# Patient Record
Sex: Female | Born: 1975 | Race: Black or African American | Hispanic: No | Marital: Single | Smoking: Never smoker
Health system: Southern US, Community
[De-identification: ages and names within clinical notes are randomized; demographics above are authoritative.]

## PROBLEM LIST (undated history)

## (undated) DIAGNOSIS — F329 Major depressive disorder, single episode, unspecified: Secondary | ICD-10-CM

## (undated) DIAGNOSIS — F32A Depression, unspecified: Secondary | ICD-10-CM

## (undated) DIAGNOSIS — I10 Essential (primary) hypertension: Secondary | ICD-10-CM

## (undated) DIAGNOSIS — K759 Inflammatory liver disease, unspecified: Secondary | ICD-10-CM

## (undated) DIAGNOSIS — K769 Liver disease, unspecified: Secondary | ICD-10-CM

## (undated) DIAGNOSIS — T7840XA Allergy, unspecified, initial encounter: Secondary | ICD-10-CM

## (undated) HISTORY — PX: CHOLECYSTECTOMY, LAPAROSCOPIC: SHX56

## (undated) HISTORY — PX: REDUCTION MAMMAPLASTY: SUR839

## (undated) HISTORY — DX: Essential (primary) hypertension: I10

## (undated) HISTORY — DX: Allergy, unspecified, initial encounter: T78.40XA

## (undated) HISTORY — DX: Liver disease, unspecified: K76.9

## (undated) HISTORY — DX: Depression, unspecified: F32.A

## (undated) HISTORY — DX: Major depressive disorder, single episode, unspecified: F32.9

---

## 2013-08-15 ENCOUNTER — Ambulatory Visit (INDEPENDENT_AMBULATORY_CARE_PROVIDER_SITE_OTHER): Payer: 59 | Admitting: Family Medicine

## 2013-08-15 VITALS — BP 118/82 | HR 72 | Temp 98.4°F | Resp 18 | Ht 65.0 in | Wt 226.0 lb

## 2013-08-15 DIAGNOSIS — I10 Essential (primary) hypertension: Secondary | ICD-10-CM

## 2013-08-15 DIAGNOSIS — Z23 Encounter for immunization: Secondary | ICD-10-CM

## 2013-08-15 MED ORDER — LISINOPRIL-HYDROCHLOROTHIAZIDE 20-25 MG PO TABS: 1.0000 | ORAL_TABLET | Freq: Every day | ORAL | Status: DC

## 2013-08-15 NOTE — Progress Notes (Signed)
Subjective: 36 year old lady who is here for refill of her blood pressure medications. She's been on blood pressure meds for a number of years. When stressed her blood pressure was operationally. She does usually take a medicine regularly and has not any major problems for her. She get some edema which is not taking her medications. She has no other major complaints and review of systems was essentially normal. She does not have her regular physician at this time and we discussed that. She would like flu shot.  Patient has lost 30 pounds since last November and is working at losing more.  Objective: Pleasant lady in no major acute distress. TMs dull. Eyes PERRLA with EOMs intact. Throat clear. Neck supple without significant nodes. No carotid bruits. Chest clear. Heart regular without murmurs. No ankle edema.  Assessment: Hypertension, controlled  Plan: Flu shot Refill her medications Return in about 6 months for recheck.

## 2013-08-15 NOTE — Patient Instructions (Signed)
Continue current blood pressure medication  Return in about 6 months to followup on blood pressure.  Advise trying to decreased eating and increased exercise and lose weight.

## 2014-09-11 ENCOUNTER — Other Ambulatory Visit: Payer: Self-pay | Admitting: Family Medicine

## 2015-02-02 ENCOUNTER — Ambulatory Visit (INDEPENDENT_AMBULATORY_CARE_PROVIDER_SITE_OTHER): Payer: 59 | Admitting: Urgent Care

## 2015-02-02 VITALS — BP 110/82 | HR 74 | Temp 98.1°F | Resp 16 | Ht 64.0 in | Wt 243.0 lb

## 2015-02-02 DIAGNOSIS — J302 Other seasonal allergic rhinitis: Secondary | ICD-10-CM | POA: Diagnosis not present

## 2015-02-02 DIAGNOSIS — I10 Essential (primary) hypertension: Secondary | ICD-10-CM

## 2015-02-02 DIAGNOSIS — G4733 Obstructive sleep apnea (adult) (pediatric): Secondary | ICD-10-CM

## 2015-02-02 MED ORDER — CETIRIZINE HCL 10 MG PO TABS
10.0000 mg | ORAL_TABLET | Freq: Every day | ORAL | Status: DC
Start: 2015-02-02 — End: 2016-05-26

## 2015-02-02 MED ORDER — LISINOPRIL-HYDROCHLOROTHIAZIDE 20-25 MG PO TABS: 1.0000 | ORAL_TABLET | Freq: Every day | ORAL | Status: DC

## 2015-02-02 NOTE — Progress Notes (Addendum)
MRN: [id number] DOB: 07/13/1976  Subjective:   Hannah Sanders is a 39 y.o. female presenting for chief complaint of medication refill for HTN, OSA and seasonal allergies.   HTN - managed with lis-HCT, reports compliance with medication, denies adverse effects including lightheadedness, dizziness. Checks BP regularly, ranges 110-120's systolic. Patient reports reports 20lbs weight gain in last 6 months. Started low carb diet 1 month ago for weight loss, no breads or sugar; diet is mainly protein, vegetables and fruit. Started exercising through her work as well. Denies smoking, rare alcohol drink.  OSA - diagnosed with OSA in 2008, has used CPAP consistently since then. Unfortunately, machine stopped working ~6 months, needs a referral for sleep study to get fitted again. Admits day time somnolence, fatigue, wakes up feeling smothered worsened by working night shifts, erratic sleep schedule. Has not previously been evaluated by ENT.  Seasonal allergies - takes Zyrtec daily. Has had intermittent breakthrough itchy, watery eyes. Denies fevers, nasal congestion, sinus pain, ear pressure, ear fullness, sore throat, post-nasal drip. Has used nasal steroid spray in the past with success, not currently using this since she feels she's doing well with Zyrtec.   No smoking, rare alcohol drink. Denies any other aggravating or relieving factors, no other questions or concerns.  Hannah Sanders has a current medication list which includes the following prescription(s): lisinopril-hydrochlorothiazide. She has No Known Allergies.  Hannah Sanders  has a past medical history of Hypertension; Depression; Liver disease; and Allergy. Also  has past surgical history that includes Coronary artery bypass graft.  ROS As in subjective.  Objective:   Vitals: BP 110/82 mmHg  Pulse 74  Temp(Src) 98.1 F (36.7 C) (Oral)  Resp 16  Ht  (1.626 m)  Wt 243 lb (110.224 kg)  BMI 41.69 kg/m2  SpO2 99%  Physical Exam    Constitutional: She is oriented to person, place, and time and well-developed, well-nourished, and in no distress.  HENT:  TM's intact bilaterally, no effusions or erythema. Nares patent, nasal turbinates pink and moist. No sinus tenderness. Pharynx not visualized due to redundant soft palate, mucous membranes moist, dentition in good repair.  Eyes: Conjunctivae are normal. Right eye exhibits no discharge. Left eye exhibits no discharge. No scleral icterus.  Neck: Normal range of motion. No thyromegaly present.  Cardiovascular: Normal rate, regular rhythm and intact distal pulses.  Exam reveals no gallop and no friction rub.   No murmur heard. Pulmonary/Chest: Effort normal. No stridor. No respiratory distress. She has no wheezes. She has no rales. She exhibits no tenderness.  Lymphadenopathy:    She has no cervical adenopathy.  Neurological: She is alert and oriented to person, place, and time.  Skin: Skin is warm and dry. No rash noted. No erythema. No pallor.   Assessment and Plan :   1. Essential hypertension 2. OSA (obstructive sleep apnea) 3. Severe obesity (BMI >= 40) - Well-controlled, continue lis-HCT, bmet to be drawn 02/03/2015 (patient had to leave to pick up children). Patient confirms that she has had tubal ligation, re: contraindications for ACEi, patient also denies ever having had ACS or CABG as was documented previously in her history. - Continue healthy diet, exercise - Referral for sleep study and ENT for further evaluation of surgical treatment for OSA - Follow up in 6 months  4. Seasonal allergies - Continue Zyrtec daily, advised that she call me if she needs rx for Flonase   Wallis Bamberg, PA-C [facility]  [phone] 02/02/2015 9:51 AM

## 2015-02-02 NOTE — Patient Instructions (Addendum)
- Please come back for a 6 month follow up on your blood pressure and weight. - Please let me know if you end up needing a nasal steroid by calling and leaving me a message. - You will get a call notifying you of your appointment times for the sleep study and referral to ENT.    Sleep Apnea  Sleep apnea is a sleep disorder characterized by abnormal pauses in breathing while you sleep. When your breathing pauses, the level of oxygen in your blood decreases. This causes you to move out of deep sleep and into light sleep. As a result, your quality of sleep is poor, and the system that carries your blood throughout your body (cardiovascular system) experiences stress. If sleep apnea remains untreated, the following conditions can develop:  High blood pressure (hypertension).  Coronary artery disease.  Inability to achieve or maintain an erection (impotence).  Impairment of your thought process (cognitive dysfunction). There are three types of sleep apnea: 1. Obstructive sleep apnea--Pauses in breathing during sleep because of a blocked airway. 2. Central sleep apnea--Pauses in breathing during sleep because the area of the brain that controls your breathing does not send the correct signals to the muscles that control breathing. 3. Mixed sleep apnea--A combination of both obstructive and central sleep apnea. RISK FACTORS The following risk factors can increase your risk of developing sleep apnea:  Being overweight.  Smoking.  Having narrow passages in your nose and throat.  Being of older age.  Being female.  Alcohol use.  Sedative and tranquilizer use.  Ethnicity. Among individuals younger than 35 years, African Americans are at increased risk of sleep apnea. SYMPTOMS   Difficulty staying asleep.  Daytime sleepiness and fatigue.  Loss of energy.  Irritability.  Loud, heavy snoring.  Morning headaches.  Trouble concentrating.  Forgetfulness.  Decreased interest in  sex. DIAGNOSIS  In order to diagnose sleep apnea, your caregiver will perform a physical examination. Your caregiver may suggest that you take a home sleep test. Your caregiver may also recommend that you spend the night in a sleep lab. In the sleep lab, several monitors record information about your heart, lungs, and brain while you sleep. Your leg and arm movements and blood oxygen level are also recorded. TREATMENT The following actions may help to resolve mild sleep apnea:  Sleeping on your side.   Using a decongestant if you have nasal congestion.   Avoiding the use of depressants, including alcohol, sedatives, and narcotics.   Losing weight and modifying your diet if you are overweight. There also are devices and treatments to help open your airway:  Oral appliances. These are custom-made mouthpieces that shift your lower jaw forward and slightly open your bite. This opens your airway.  Devices that create positive airway pressure. This positive pressure "splints" your airway open to help you breathe better during sleep. The following devices create positive airway pressure:  Continuous positive airway pressure (CPAP) device. The CPAP device creates a continuous level of air pressure with an air pump. The air is delivered to your airway through a mask while you sleep. This continuous pressure keeps your airway open.  Nasal expiratory positive airway pressure (EPAP) device. The EPAP device creates positive air pressure as you exhale. The device consists of single-use valves, which are inserted into each nostril and held in place by adhesive. The valves create very little resistance when you inhale but create much more resistance when you exhale. That increased resistance creates the positive airway  pressure. This positive pressure while you exhale keeps your airway open, making it easier to breath when you inhale again.  Bilevel positive airway pressure (BPAP) device. The BPAP device  is used mainly in patients with central sleep apnea. This device is similar to the CPAP device because it also uses an air pump to deliver continuous air pressure through a mask. However, with the BPAP machine, the pressure is set at two different levels. The pressure when you exhale is lower than the pressure when you inhale.  Surgery. Typically, surgery is only done if you cannot comply with less invasive treatments or if the less invasive treatments do not improve your condition. Surgery involves removing excess tissue in your airway to create a wider passage way. Document Released: 11/07/2002 Document Revised: 03/14/2013 Document Reviewed: 03/25/2012 [facility] Patient Information 2015 [place], [place]. This information is not intended to replace advice given to you by your health care provider. Make sure you discuss any questions you have with your health care provider.

## 2015-02-03 ENCOUNTER — Other Ambulatory Visit (INDEPENDENT_AMBULATORY_CARE_PROVIDER_SITE_OTHER): Payer: 59 | Admitting: *Deleted

## 2015-02-03 DIAGNOSIS — I10 Essential (primary) hypertension: Secondary | ICD-10-CM

## 2015-02-03 LAB — BASIC METABOLIC PANEL
BUN: 14 mg/dL (ref 6–23)
CHLORIDE: 105 meq/L (ref 96–112)
CO2: 26 mEq/L (ref 19–32)
Calcium: 9.1 mg/dL (ref 8.4–10.5)
Creat: 0.95 mg/dL (ref 0.50–1.10)
GLUCOSE: 85 mg/dL (ref 70–99)
Potassium: 4.2 mEq/L (ref 3.5–5.3)
Sodium: 140 mEq/L (ref 135–145)

## 2015-02-03 MED ORDER — LISINOPRIL-HYDROCHLOROTHIAZIDE 20-25 MG PO TABS: 1.0000 | ORAL_TABLET | Freq: Every day | ORAL | Status: DC

## 2015-02-03 NOTE — Progress Notes (Unsigned)
Pt here for lab draw only  

## 2015-02-05 ENCOUNTER — Encounter: Payer: Self-pay | Admitting: Family Medicine

## 2015-02-26 ENCOUNTER — Ambulatory Visit (INDEPENDENT_AMBULATORY_CARE_PROVIDER_SITE_OTHER): Payer: 59 | Admitting: Physician Assistant

## 2015-02-26 VITALS — BP 124/82 | HR 90 | Temp 98.6°F | Resp 17 | Ht 64.0 in | Wt 238.0 lb

## 2015-02-26 DIAGNOSIS — J209 Acute bronchitis, unspecified: Secondary | ICD-10-CM | POA: Diagnosis not present

## 2015-02-26 MED ORDER — IPRATROPIUM BROMIDE 0.03 % NA SOLN: 2.0000 | Freq: Two times a day (BID) | NASAL | Status: DC

## 2015-02-26 MED ORDER — ALBUTEROL SULFATE HFA 108 (90 BASE) MCG/ACT IN AERS: 2.0000 | INHALATION_SPRAY | RESPIRATORY_TRACT | Status: DC | PRN

## 2015-02-26 MED ORDER — GUAIFENESIN ER 1200 MG PO TB12: 1.0000 | ORAL_TABLET | Freq: Two times a day (BID) | ORAL | Status: DC | PRN

## 2015-02-26 MED ORDER — BENZONATATE 100 MG PO CAPS: 100.0000 mg | ORAL_CAPSULE | Freq: Three times a day (TID) | ORAL | Status: DC | PRN

## 2015-02-26 NOTE — Patient Instructions (Signed)

## 2015-02-26 NOTE — Progress Notes (Signed)
Subjective:    Patient ID: Hannah Sanders, female    DOB: 1975-12-24, 39 y.o.   MRN: [id number]  HPI Patient presents for dry cough, chills, and congestion that have been present for 3 days. States that she feels better than yesterday, but still feels weak and tired. Cough has gotten progressively worse and is causing sore throat. Additionally endorses HA, gurgling in throat when breathing, decreased appetite, and a feeling like something is stuck in throat. Had chills and sweating at night resolved yesterday. Denies sinus/ear pressure, rhinorrhea, SOB, wheezing, dizziness, and N/V.Has seasonal allergies and endorses itchy water eyes that started prior to current issue. No h/o asthma. Denies sick contacts and has tried AlkaSeltzer cold with marginal relief.    Review of Systems  Constitutional: Positive for chills (resolved), diaphoresis (resolved), appetite change and fatigue. Negative for fever and activity change.  HENT: Positive for congestion and sore throat. Negative for ear discharge, ear pain, postnasal drip, rhinorrhea, sinus pressure, sneezing and trouble swallowing.   Eyes: Positive for discharge and itching. Negative for pain and redness.  Respiratory: Positive for cough. Negative for chest tightness, shortness of breath and wheezing.   Cardiovascular: Negative for chest pain.  Gastrointestinal: Negative for nausea and vomiting.  Musculoskeletal: Negative for neck pain.  Allergic/Immunologic: Positive for environmental allergies.  Neurological: Positive for headaches. Negative for dizziness.       Objective:   Physical Exam  Constitutional: She is oriented to person, place, and time. She appears well-developed and well-nourished. No distress.  Blood pressure 124/82, pulse 90, temperature 98.6 F (37 C), temperature source Oral, resp. rate 17, height  (1.626 m), weight 238 lb (107.956 kg), last menstrual period 12/28/2014, SpO2 96 %.  HENT:  Head: Normocephalic and  atraumatic.  Right Ear: Tympanic membrane, external ear and ear canal normal.  Left Ear: Tympanic membrane, external ear and ear canal normal.  Nose: Rhinorrhea (with marginal erythema) present. No mucosal edema. Right sinus exhibits no maxillary sinus tenderness and no frontal sinus tenderness. Left sinus exhibits no maxillary sinus tenderness and no frontal sinus tenderness.  Mouth/Throat: Uvula is midline, oropharynx is clear and moist and mucous membranes are normal. No oropharyngeal exudate, posterior oropharyngeal edema, posterior oropharyngeal erythema or tonsillar abscesses.  Eyes: Conjunctivae are normal. Pupils are equal, round, and reactive to light. Right eye exhibits no discharge. Left eye exhibits no discharge. No scleral icterus.  Neck: Normal range of motion. Neck supple. No thyromegaly present.  Cardiovascular: Normal heart sounds.  Exam reveals no gallop and no friction rub.   No murmur heard. Pulmonary/Chest: Effort normal. No accessory muscle usage. No respiratory distress. She has no decreased breath sounds. She has wheezes (mild) in the right upper field. She has no rhonchi. She has no rales. She exhibits no tenderness.  Abdominal: Soft. Bowel sounds are normal. There is no tenderness.  Lymphadenopathy:    She has cervical adenopathy.  Neurological: She is alert and oriented to person, place, and time.  Skin: Skin is warm and dry. No rash noted. She is not diaphoretic. No erythema. No pallor.       Assessment & Plan:  1. Acute bronchitis, unspecified organism Mild wheezing on PE should use inhaler every 4 hours for next 2 days. If not improvement by end of week should call back and will send Zpack to pharmacy. - albuterol (PROVENTIL HFA;VENTOLIN HFA) 108 (90 BASE) MCG/ACT inhaler; Inhale 2 puffs into the lungs every 4 (four) hours as needed for wheezing or shortness  of breath (cough, shortness of breath or wheezing.).  Dispense: 1 Inhaler; Refill: 1 - benzonatate  (TESSALON) 100 MG capsule; Take 1-2 capsules (100-200 mg total) by mouth 3 (three) times daily as needed for cough.  Dispense: 40 capsule; Refill: 0 - ipratropium (ATROVENT) 0.03 % nasal spray; Place 2 sprays into both nostrils 2 (two) times daily.  Dispense: 30 mL; Refill: 0 - Guaifenesin (MUCINEX MAXIMUM STRENGTH) 1200 MG TB12; Take 1 tablet (1,200 mg total) by mouth every 12 (twelve) hours as needed.  Dispense: 14 tablet; Refill: 1   Maisa Bedingfield PA-C  [facility] 02/26/2015 9:32 AM

## 2015-03-01 ENCOUNTER — Telehealth: Payer: Self-pay

## 2015-03-01 MED ORDER — AZITHROMYCIN 250 MG PO TABS: ORAL_TABLET | ORAL | Status: DC

## 2015-03-01 NOTE — Telephone Encounter (Signed)
Azithromycin sent to pharmacy. Two capsules 1st day then once daily for next 4 days. Please let her know to return to clinic if she is not better after a few days of the antibiotic.

## 2015-03-01 NOTE — Telephone Encounter (Signed)
Gave pt message. She also states atrovent isn't working.

## 2015-03-01 NOTE — Telephone Encounter (Signed)
Patient states if she was not any better an antibiotic would be called in for her.   She is not better actually worse.  Please call an antibiotic into [facility] on [address]    [phone]

## 2015-03-01 NOTE — Telephone Encounter (Signed)
The atrovent is not working for what? The nasal congestion? Can you please send the reply to T[facility] as I was happy to fill the azithro but it would be best for [facility] to take a look at this since she saw the pt. Thanks.

## 2015-03-01 NOTE — Telephone Encounter (Signed)
1. Acute bronchitis, unspecified organism Mild wheezing on PE should use inhaler every 4 hours for next 2 days. If not improvement by end of week should call back and will send Zpack to pharmacy.  Please advise. Pt seen on 02/26/2015.

## 2015-03-02 NOTE — Telephone Encounter (Signed)
Left message for pt to call back  °

## 2015-03-03 ENCOUNTER — Telehealth: Payer: Self-pay | Admitting: Physician Assistant

## 2015-03-03 NOTE — Telephone Encounter (Signed)
Spoke with patient. She has picked up antibiotic and is still taking inhaler. Breathing has improved. Atrovent was not working for her so advised her to d/c. Did not get Mucinex, but plans on picking it up today.

## 2015-03-03 NOTE — Telephone Encounter (Signed)
Hannah Sanders-- FYI. Please advise.

## 2015-04-23 ENCOUNTER — Telehealth: Payer: Self-pay

## 2015-04-23 NOTE — Telephone Encounter (Signed)
Patient is requesting our office to put in another referral for her to have a sleep study. Per patient the one enter prior she waited too long to be seen and [facility] will not see her without one. Patients call back number is [phone]

## 2015-04-24 ENCOUNTER — Other Ambulatory Visit: Payer: Self-pay | Admitting: Radiology

## 2015-04-24 DIAGNOSIS — G4733 Obstructive sleep apnea (adult) (pediatric): Secondary | ICD-10-CM

## 2015-04-24 NOTE — Telephone Encounter (Signed)
Resent referral and notified pt.

## 2015-04-24 NOTE — Telephone Encounter (Signed)
Is this ok to do?

## 2015-04-24 NOTE — Telephone Encounter (Signed)
Ok to resend referral ?

## 2015-05-09 ENCOUNTER — Telehealth: Payer: Self-pay

## 2015-05-09 ENCOUNTER — Institutional Professional Consult (permissible substitution): Payer: 59 | Admitting: Neurology

## 2015-05-09 ENCOUNTER — Telehealth: Payer: Self-pay | Admitting: Neurology

## 2015-05-09 NOTE — Telephone Encounter (Signed)
No show for sleep medicine consultation on 05-09-15.  This patient has not responded to a previous referral of March 2016 when called to make appointment . She did not cancel.

## 2015-05-09 NOTE — Telephone Encounter (Signed)
Pt did not show for her appt with Dr. Dohmeier today. 

## 2015-05-22 ENCOUNTER — Encounter: Payer: Self-pay | Admitting: Neurology

## 2016-02-27 ENCOUNTER — Ambulatory Visit (INDEPENDENT_AMBULATORY_CARE_PROVIDER_SITE_OTHER): Payer: 59

## 2016-02-27 ENCOUNTER — Ambulatory Visit (INDEPENDENT_AMBULATORY_CARE_PROVIDER_SITE_OTHER): Payer: 59 | Admitting: Family Medicine

## 2016-02-27 VITALS — BP 120/70 | HR 81 | Temp 99.0°F | Resp 20 | Ht 64.0 in | Wt 200.6 lb

## 2016-02-27 DIAGNOSIS — R6889 Other general symptoms and signs: Secondary | ICD-10-CM

## 2016-02-27 DIAGNOSIS — J209 Acute bronchitis, unspecified: Secondary | ICD-10-CM

## 2016-02-27 DIAGNOSIS — J9801 Acute bronchospasm: Secondary | ICD-10-CM

## 2016-02-27 DIAGNOSIS — J111 Influenza due to unidentified influenza virus with other respiratory manifestations: Secondary | ICD-10-CM

## 2016-02-27 LAB — POCT INFLUENZA A/B
INFLUENZA B, POC: NEGATIVE
Influenza A, POC: NEGATIVE

## 2016-02-27 MED ORDER — OSELTAMIVIR PHOSPHATE 75 MG PO CAPS: 75.0000 mg | ORAL_CAPSULE | Freq: Two times a day (BID) | ORAL | Status: DC

## 2016-02-27 MED ORDER — GUAIFENESIN ER 1200 MG PO TB12: 1.0000 | ORAL_TABLET | Freq: Two times a day (BID) | ORAL | Status: DC | PRN

## 2016-02-27 MED ORDER — BENZONATATE 100 MG PO CAPS
100.0000 mg | ORAL_CAPSULE | Freq: Three times a day (TID) | ORAL | Status: DC | PRN
Start: 2016-02-27 — End: 2016-05-26

## 2016-02-27 MED ORDER — ALBUTEROL SULFATE HFA 108 (90 BASE) MCG/ACT IN AERS: 2.0000 | INHALATION_SPRAY | Freq: Four times a day (QID) | RESPIRATORY_TRACT | Status: DC | PRN

## 2016-02-27 NOTE — Progress Notes (Signed)
Subjective:    Patient ID: Hannah Sanders, female    DOB: 10/25/1976, 40 y.o.   MRN: [id number]  02/27/2016  Influenza and Anxiety   HPI This 40 y.o. female presents for evaluation of flu like symptoms.  Onset with fever/chills.  +achy; body aches.  +coughing.  +sore throat.  +rhinorrhea; no nasal congestion.  No v; no eating in three days.  No diarrhea but loose.  +SOB with laying down.  No chest pain. +wheezing; no asthma; no tobacco.  No flu vaccine.  Works at [employer]; lots of coworkers sick.    Anxiety: feels anxious.   PCP: none   Review of Systems  Constitutional: Negative for fever, chills, diaphoresis and fatigue.  Eyes: Negative for visual disturbance.  Respiratory: Negative for cough and shortness of breath.   Cardiovascular: Negative for chest pain, palpitations and leg swelling.  Gastrointestinal: Negative for nausea, vomiting, abdominal pain, diarrhea and constipation.  Endocrine: Negative for cold intolerance, heat intolerance, polydipsia, polyphagia and polyuria.  Neurological: Negative for dizziness, tremors, seizures, syncope, facial asymmetry, speech difficulty, weakness, light-headedness, numbness and headaches.    Past Medical History  Diagnosis Date  . Hypertension   . Depression   . Liver disease     Diagnosed With Hep C  . Allergy    History reviewed. No pertinent past surgical history. No Known Allergies  Social History   Social History  . Marital Status: Single    Spouse Name: N/A  . Number of Children: N/A  . Years of Education: N/A   Occupational History  . Not on file.   Social History Main Topics  . Smoking status: Never Smoker   . Smokeless tobacco: Never Used  . Alcohol Use: Yes  . Drug Use: No  . Sexual Activity: Not on file   Other Topics Concern  . Not on file   Social History Narrative   Family History  Problem Relation Age of Onset  . Heart disease Mother   . Hypertension Mother   . Heart disease Father     . Hypertension Father   . Heart disease Brother   . Heart disease Maternal Grandmother   . Hypertension Maternal Grandmother        Objective:    BP 120/70 mmHg  Pulse 81  Temp(Src) 99 F (37.2 C) (Oral)  Resp 20  Ht 5\' 4"  (1.626 m)  Wt 200 lb 9.6 oz (90.992 kg)  BMI 34.42 kg/m2  SpO2 98%  LMP 02/13/2016 Physical Exam  Constitutional: She is oriented to person, place, and time. She appears well-developed and well-nourished. No distress.  HENT:  Head: Normocephalic and atraumatic.  Right Ear: External ear normal.  Left Ear: External ear normal.  Nose: Nose normal.  Mouth/Throat: Oropharynx is clear and moist.  Eyes: Conjunctivae and EOM are normal. Pupils are equal, round, and reactive to light.  Neck: Normal range of motion. Neck supple. Carotid bruit is not present. No thyromegaly present.  Cardiovascular: Normal rate, regular rhythm, normal heart sounds and intact distal pulses.  Exam reveals no gallop and no friction rub.   No murmur heard. Pulmonary/Chest: Effort normal and breath sounds normal. She has no wheezes. She has no rales.  Abdominal: Soft. Bowel sounds are normal. She exhibits no distension and no mass. There is no tenderness. There is no rebound and no guarding.  Lymphadenopathy:    She has no cervical adenopathy.  Neurological: She is alert and oriented to person, place, and time. No cranial nerve deficit.  Skin: Skin is warm and dry. No rash noted. She is not diaphoretic. No erythema. No pallor.  Psychiatric: She has a normal mood and affect. Her behavior is normal.   Results for orders placed or performed in visit on 02/27/16  POCT Influenza A/B  Result Value Ref Range   Influenza A, POC Negative Negative   Influenza B, POC Negative Negative       Assessment & Plan:   1. Influenza   2. Flu-like symptoms   3. Acute bronchitis, unspecified organism   4. Bronchospasm     Orders Placed This Encounter  Procedures  . DG Chest 2 View    Standing  Status: Future     Number of Occurrences: 1     Standing Expiration Date: 02/26/2017    Order Specific Question:  Reason for Exam (SYMPTOM  OR DIAGNOSIS REQUIRED)    Answer:  SOB, flu like symptoms    Order Specific Question:  Is the patient pregnant?    Answer:  No    Order Specific Question:  Preferred imaging location?    Answer:  External  . POCT Influenza A/B   Meds ordered this encounter  Medications  . oseltamivir (TAMIFLU) 75 MG capsule    Sig: Take 1 capsule (75 mg total) by mouth 2 (two) times daily.    Dispense:  10 capsule    Refill:  0  . Guaifenesin (MUCINEX MAXIMUM STRENGTH) 1200 MG TB12    Sig: Take 1 tablet (1,200 mg total) by mouth every 12 (twelve) hours as needed.    Dispense:  20 tablet    Refill:  0  . benzonatate (TESSALON) 100 MG capsule    Sig: Take 1-2 capsules (100-200 mg total) by mouth 3 (three) times daily as needed for cough.    Dispense:  40 capsule    Refill:  0  . albuterol (PROVENTIL HFA;VENTOLIN HFA) 108 (90 Base) MCG/ACT inhaler    Sig: Inhale 2 puffs into the lungs every 6 (six) hours as needed for wheezing or shortness of breath (cough, shortness of breath or wheezing.).    Dispense:  1 Inhaler    Refill:  0    No Follow-up on file.    Kally Cadden Paulita Fujita, M.D. [facility] [address] [place], [place]  [phone] phone [phone] fax

## 2016-02-27 NOTE — Patient Instructions (Addendum)
IF you received an x-ray today, you will receive an invoice from [facility]. Please contact [facility] at [phone] with questions or concerns regarding your invoice.   IF you received labwork today, you will receive an invoice from [employer]. Please contact [facility] at [phone] with questions or concerns regarding your invoice.   Our billing staff will not be able to assist you with questions regarding bills from these companies.  You will be contacted with the lab results as soon as they are available. The fastest way to get your results is to activate your My Chart account. Instructions are located on the last page of this paperwork. If you have not heard from [place] regarding the results in 2 weeks, please contact this office.     TYLENOL EVERY EIGHT HOURS ALTERNATING WITH IBUPROFEN EVERY EIGHT HOURS. USE INHALER 2 PUFFS EVERY SIX HOURS FOR ONE WEEK.   Influenza, Adult Influenza ("the flu") is a viral infection of the respiratory tract. It occurs more often in winter months because people spend more time in close contact with one another. Influenza can make you feel very sick. Influenza easily spreads from person to person (contagious). CAUSES  Influenza is caused by a virus that infects the respiratory tract. You can catch the virus by breathing in droplets from an infected person's cough or sneeze. You can also catch the virus by touching something that was recently contaminated with the virus and then touching your mouth, nose, or eyes. RISKS AND COMPLICATIONS You may be at risk for a more severe case of influenza if you smoke cigarettes, have diabetes, have chronic heart disease (such as heart failure) or lung disease (such as asthma), or if you have a weakened immune system. Elderly people and pregnant women are also at risk for more serious infections. The most common problem of influenza is a lung infection (pneumonia).  Sometimes, this problem can require emergency medical care and may be life threatening. SIGNS AND SYMPTOMS  Symptoms typically last 4 to 10 days and may include:  Fever.  Chills.  Headache, body aches, and muscle aches.  Sore throat.  Chest discomfort and cough.  Poor appetite.  Weakness or feeling tired.  Dizziness.  Nausea or vomiting. DIAGNOSIS  Diagnosis of influenza is often made based on your history and a physical exam. A nose or throat swab test can be done to confirm the diagnosis. TREATMENT  In mild cases, influenza goes away on its own. Treatment is directed at relieving symptoms. For more severe cases, your health care provider may prescribe antiviral medicines to shorten the sickness. Antibiotic medicines are not effective because the infection is caused by a virus, not by bacteria. HOME CARE INSTRUCTIONS  Take medicines only as directed by your health care provider.  Use a cool mist humidifier to make breathing easier.  Get plenty of rest until your temperature returns to normal. This usually takes 3 to 4 days.  Drink enough fluid to keep your urine clear or pale yellow.  Cover yourmouth and nosewhen coughing or sneezing,and wash your handswellto prevent thevirusfrom spreading.  Stay homefromwork orschool untilthe fever is gonefor at least 63full day. PREVENTION  An annual influenza vaccination (flu shot) is the best way to avoid getting influenza. An annual flu shot is now routinely recommended for all adults in the [place] SEEK MEDICAL CARE IF:  You experiencechest pain, yourcough worsens,or you producemore mucus.  Youhave nausea,vomiting, ordiarrhea.  Your fever returns or gets worse. SEEK  IMMEDIATE MEDICAL CARE IF:  You havetrouble breathing, you become short of breath,or your skin ornails becomebluish.  You have severe painor stiffnessin the neck.  You develop a sudden headache, or pain in the face or ear.  You have  nausea or vomiting that you cannot control. MAKE SURE YOU:   Understand these instructions.  Will watch your condition.  Will get help right away if you are not doing well or get worse.   This information is not intended to replace advice given to you by your health care provider. Make sure you discuss any questions you have with your health care provider.   Document Released: 11/14/2000 Document Revised: 12/08/2014 Document Reviewed: 02/16/2012 Elsevier Interactive Patient Education [employer]2016 Elsevier Inc.

## 2016-03-11 DIAGNOSIS — IMO0002 Reserved for concepts with insufficient information to code with codable children: Secondary | ICD-10-CM | POA: Insufficient documentation

## 2016-04-04 ENCOUNTER — Other Ambulatory Visit: Payer: Self-pay | Admitting: Urgent Care

## 2016-05-01 HISTORY — PX: ABDOMINAL SURGERY: SHX537

## 2016-05-22 ENCOUNTER — Ambulatory Visit (INDEPENDENT_AMBULATORY_CARE_PROVIDER_SITE_OTHER): Payer: 59 | Admitting: Family Medicine

## 2016-05-22 VITALS — BP 122/80 | HR 68 | Temp 98.6°F | Resp 18 | Ht 64.0 in | Wt 205.0 lb

## 2016-05-22 DIAGNOSIS — M545 Low back pain, unspecified: Secondary | ICD-10-CM

## 2016-05-22 DIAGNOSIS — Z4802 Encounter for removal of sutures: Secondary | ICD-10-CM | POA: Diagnosis not present

## 2016-05-22 DIAGNOSIS — T8131XA Disruption of external operation (surgical) wound, not elsewhere classified, initial encounter: Secondary | ICD-10-CM | POA: Diagnosis not present

## 2016-05-22 MED ORDER — CYCLOBENZAPRINE HCL 10 MG PO TABS: 10.0000 mg | ORAL_TABLET | Freq: Three times a day (TID) | ORAL | Status: DC | PRN

## 2016-05-22 NOTE — Patient Instructions (Addendum)
Please return in 7 days to have your suture removed  Laceration Care, Adult A laceration is a cut that goes through all of the layers of the skin and into the tissue that is right under the skin. Some lacerations heal on their own. Others need to be closed with stitches (sutures), staples, skin adhesive strips, or skin glue. Proper laceration care minimizes the risk of infection and helps the laceration to heal better. HOW TO CARE FOR YOUR LACERATION If sutures or staples were used:  Keep the wound clean and dry.  If you were given a bandage (dressing), you should change it at least one time per day or as told by your health care provider. You should also change it if it becomes wet or dirty.  Keep the wound completely dry for the first 24 hours or as told by your health care provider. After that time, you may shower or bathe. However, make sure that the wound is not soaked in water until after the sutures or staples have been removed.  Clean the wound one time each day or as told by your health care provider:  Wash the wound with soap and water.  Rinse the wound with water to remove all soap.  Pat the wound dry with a clean towel. Do not rub the wound.  After cleaning the wound, apply a thin layer of antibiotic ointmentas told by your health care provider. This will help to prevent infection and keep the dressing from sticking to the wound.  Have the sutures or staples removed as told by your health care provider. If skin adhesive strips were used:  Keep the wound clean and dry.  If you were given a bandage (dressing), you should change it at least one time per day or as told by your health care provider. You should also change it if it becomes dirty or wet.  Do not get the skin adhesive strips wet. You may shower or bathe, but be careful to keep the wound dry.  If the wound gets wet, pat it dry with a clean towel. Do not rub the wound.  Skin adhesive strips fall off on their own.  You may trim the strips as the wound heals. Do not remove skin adhesive strips that are still stuck to the wound. They will fall off in time. If skin glue was used:  Try to keep the wound dry, but you may briefly wet it in the shower or bath. Do not soak the wound in water, such as by swimming.  After you have showered or bathed, gently pat the wound dry with a clean towel. Do not rub the wound.  Do not do any activities that will make you sweat heavily until the skin glue has fallen off on its own.  Do not apply liquid, cream, or ointment medicine to the wound while the skin glue is in place. Using those may loosen the film before the wound has healed.  If you were given a bandage (dressing), you should change it at least one time per day or as told by your health care provider. You should also change it if it becomes dirty or wet.  If a dressing is placed over the wound, be careful not to apply tape directly over the skin glue. Doing that may cause the glue to be pulled off before the wound has healed.  Do not pick at the glue. The skin glue usually remains in place for 5-10 days, then it falls  off of the skin. General Instructions  Take over-the-counter and prescription medicines only as told by your health care provider.  If you were prescribed an antibiotic medicine or ointment, take or apply it as told by your doctor. Do not stop using it even if your condition improves.  To help prevent scarring, make sure to cover your wound with sunscreen whenever you are outside after stitches are removed, after adhesive strips are removed, or when glue remains in place and the wound is healed. Make sure to wear a sunscreen of at least 30 SPF.  Do not scratch or pick at the wound.  Keep all follow-up visits as told by your health care provider. This is important.  Check your wound every day for signs of infection. Watch for:  Redness, swelling, or pain.  Fluid, blood, or pus.  Raise  (elevate) the injured area above the level of your heart while you are sitting or lying down, if possible. SEEK MEDICAL CARE IF:  You received a tetanus shot and you have swelling, severe pain, redness, or bleeding at the injection site.  You have a fever.  A wound that was closed breaks open.  You notice a bad smell coming from your wound or your dressing.  You notice something coming out of the wound, such as wood or glass.  Your pain is not controlled with medicine.  You have increased redness, swelling, or pain at the site of your wound.  You have fluid, blood, or pus coming from your wound.  You notice a change in the color of your skin near your wound.  You need to change the dressing frequently due to fluid, blood, or pus draining from the wound.  You develop a new rash.  You develop numbness around the wound. SEEK IMMEDIATE MEDICAL CARE IF:  You develop severe swelling around the wound.  Your pain suddenly increases and is severe.  You develop painful lumps near the wound or on skin that is anywhere on your body.  You have a red streak going away from your wound.  The wound is on your hand or foot and you cannot properly move a finger or toe.  The wound is on your hand or foot and you notice that your fingers or toes look pale or bluish.   This information is not intended to replace advice given to you by your health care provider. Make sure you discuss any questions you have with your health care provider.   Document Released: 11/17/2005 Document Revised: 04/03/2015 Document Reviewed: 11/13/2014 Elsevier Interactive Patient Education 2016 [employer]     IF you received an x-ray today, you will receive an invoice from [facility]. Please contact [facility] at [phone] with questions or concerns regarding your invoice.   IF you received labwork today, you will receive an invoice from [facility]. Please  contact [facility] at [phone] with questions or concerns regarding your invoice.   Our billing staff will not be able to assist you with questions regarding bills from these companies.  You will be contacted with the lab results as soon as they are available. The fastest way to get your results is to activate your My Chart account. Instructions are located on the last page of this paperwork. If you have not heard from us regarding the results in 2 weeks, please contact this office.    We recommend that you schedule a mammogram for breast cancer screening. Typically, you do not need a referral  to do this. Please contact a local imaging center to schedule your mammogram.  [facility] - [phone]  *ask for the Radiology Department [facility]) - [phone] or [phone]) [phone]  [facility] - [phone] [facility] - [phone] [facility] - [phone]  *ask for the Radiology Department [facility] - [phone]  *ask for the Radiology Department [facility] - [phone]  *ask for the Mammography Department [facility] - [phone]

## 2016-05-22 NOTE — Progress Notes (Signed)
Verbal consent obtained.  Cleansed with alcohol swab.  1% lidocaine without epi placed at procedure site.  <.7cm open wound sutured with 5-0 ethilon in 1 simple interrupted suture.  Cleansed with normal saline.  Dressing applied.

## 2016-05-22 NOTE — Progress Notes (Signed)
   Subjective:    Patient ID: Hannah Sanders, female    DOB: 02/06/1976, 40 y.o.   MRN: [id number]  HPI This is a 40 yo female who presents today following abdominoplasty surgery in [place]. The surgery was 05/09/16 and she presents today for removal of sutures. She was late seeking removal of sutures because the pain caused her to be homebound. Someone has brought her here today as she feels unable to drive. She went to her PCP who was unable to remove her sutures.   She has had worsening of chronic, low back pain since surgery. She has been unable to sleep due to pain and requests something to help her. She has used all of the pain medication she was given following surgery. No numbness, no weakness, no falls.   Past Medical History  Diagnosis Date  . Hypertension   . Depression   . Liver disease     Diagnosed With Hep C  . Allergy    History reviewed. No pertinent past surgical history. Family History  Problem Relation Age of Onset  . Heart disease Mother   . Hypertension Mother   . Heart disease Father   . Hypertension Father   . Heart disease Brother   . Heart disease Maternal Grandmother   . Hypertension Maternal Grandmother    Social History  Substance Use Topics  . Smoking status: Never Smoker   . Smokeless tobacco: Never Used  . Alcohol Use: Yes     Review of Systems No fever/chills, no nausea/vomiting, + abdominal pain at surgical site, + low back pain    Objective:   Physical Exam  Constitutional: She is oriented to person, place, and time. She appears well-developed and well-nourished.  obese  HENT:  Head: Normocephalic and atraumatic.  Eyes: Conjunctivae are normal.  Cardiovascular: Normal rate.   Pulmonary/Chest: Effort normal.  Abdominal: Soft. She exhibits no distension.  Neurological: She is alert and oriented to person, place, and time.  Skin: Skin is warm and dry.  Single suture removed from left and right side of abdomen. Multiple sutures  removed from umbilicus. Small area of dehiscence which was closed by Trena PlattStephanie English, PA-C.   Psychiatric: She has a normal mood and affect. Her behavior is normal. Judgment and thought content normal.  Vitals reviewed.     BP 122/80 mmHg  Pulse 68  Temp(Src) 98.6 F (37 C) (Oral)  Resp 18  Ht 5\' 4"  (1.626 m)  Wt 205 lb (92.987 kg)  BMI 35.17 kg/m2  SpO2 97%  LMP 05/10/2016 Wt Readings from Last 3 Encounters:  05/22/16 205 lb (92.987 kg)  02/27/16 200 lb 9.6 oz (90.992 kg)  02/26/15 238 lb (107.956 kg)       Assessment & Plan:  1. Bilateral low back pain without sciatica - encouraged gentle stretching, heat prn - cyclobenzaprine (FLEXERIL) 10 MG tablet; Take 1 tablet (10 mg total) by mouth 3 (three) times daily as needed for muscle spasms.  Dispense: 30 tablet; Refill: 0  2. Encounter for removal of sutures - provided instructions for after care  3. Wound dehiscence, initial encounter - RTC in 7 days for suture removal - Provided written and verbal information regarding diagnosis and treatment.     Olean Reeeborah Gessner, FNP-BC  [facility]  05/26/2016 7:35 PM

## 2016-05-29 ENCOUNTER — Other Ambulatory Visit: Payer: Self-pay | Admitting: Urgent Care

## 2016-05-31 HISTORY — PX: BREAST SURGERY: SHX581

## 2016-06-19 ENCOUNTER — Other Ambulatory Visit: Payer: Self-pay | Admitting: Plastic Surgery

## 2016-09-27 ENCOUNTER — Emergency Department (HOSPITAL_COMMUNITY)
Admission: EM | Admit: 2016-09-27 | Discharge: 2016-09-27 | Disposition: A | Discharge status: Home / Self Care | Payer: No Typology Code available for payment source | Attending: Emergency Medicine | Admitting: Emergency Medicine

## 2016-09-27 ENCOUNTER — Encounter (HOSPITAL_COMMUNITY): Payer: Self-pay | Admitting: *Deleted

## 2016-09-27 DIAGNOSIS — Y999 Unspecified external cause status: Secondary | ICD-10-CM | POA: Insufficient documentation

## 2016-09-27 DIAGNOSIS — Y939 Activity, unspecified: Secondary | ICD-10-CM | POA: Insufficient documentation

## 2016-09-27 DIAGNOSIS — M542 Cervicalgia: Secondary | ICD-10-CM | POA: Insufficient documentation

## 2016-09-27 DIAGNOSIS — Y92410 Unspecified street and highway as the place of occurrence of the external cause: Secondary | ICD-10-CM | POA: Diagnosis not present

## 2016-09-27 DIAGNOSIS — M545 Low back pain, unspecified: Secondary | ICD-10-CM

## 2016-09-27 MED ORDER — HYDROCODONE-ACETAMINOPHEN 5-325 MG PO TABS: 1.0000 | ORAL_TABLET | Freq: Four times a day (QID) | ORAL | 0 refills | Status: DC | PRN

## 2016-09-27 MED ORDER — CYCLOBENZAPRINE HCL 10 MG PO TABS: 10.0000 mg | ORAL_TABLET | Freq: Three times a day (TID) | ORAL | 0 refills | Status: DC | PRN
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
## 2017-02-25 ENCOUNTER — Other Ambulatory Visit: Payer: Self-pay

## 2017-05-07 ENCOUNTER — Other Ambulatory Visit: Payer: Self-pay | Admitting: Obstetrics and Gynecology

## 2017-05-07 DIAGNOSIS — R234 Changes in skin texture: Secondary | ICD-10-CM

## 2017-06-23 ENCOUNTER — Encounter: Payer: Self-pay | Admitting: Obstetrics & Gynecology

## 2017-06-23 ENCOUNTER — Ambulatory Visit (INDEPENDENT_AMBULATORY_CARE_PROVIDER_SITE_OTHER): Payer: 59 | Admitting: Obstetrics & Gynecology

## 2017-06-23 VITALS — BP 134/88 | Ht 65.0 in | Wt 223.0 lb

## 2017-06-23 DIAGNOSIS — Z30430 Encounter for insertion of intrauterine contraceptive device: Secondary | ICD-10-CM | POA: Diagnosis not present

## 2017-06-23 DIAGNOSIS — B977 Papillomavirus as the cause of diseases classified elsewhere: Secondary | ICD-10-CM | POA: Diagnosis not present

## 2017-06-23 DIAGNOSIS — R87612 Low grade squamous intraepithelial lesion on cytologic smear of cervix (LGSIL): Secondary | ICD-10-CM | POA: Diagnosis not present

## 2017-06-23 DIAGNOSIS — Z3009 Encounter for other general counseling and advice on contraception: Secondary | ICD-10-CM | POA: Diagnosis not present

## 2017-06-23 NOTE — Progress Notes (Signed)
    Hannah Sanders 12/30/1975 [id number]        41 y.o.  G4P0013 single.  Boyfriend.  3 children.  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  Last Pap 01/2017 LGSIL/HPV HR pos.  STI screen 01/2017 otherwise neg.  H/O Genital HSV.  Boyfriend has a Vasectomy, but would like additional contraception.  Has used Mirena IUD with success in the past and would like to be on it again.  Menses regular normal qmonth.  Currently on period, LMP 7/21st.  No pelvic pain.  No abnormal vaginal d/c.      Past medical history,surgical history, problem list, medications, allergies, family history and social history were all reviewed and documented in the EPIC chart.  Directed ROS with pertinent positives and negatives documented in the history of present illness/assessment and plan.  Exam:  Vitals:   06/23/17 1245  BP: 134/88  Weight: 223 lb (101.2 kg)  Height: 5\' 5"  (1.651 m)   General appearance:  Normal  IUD procedure note       Patient presented to the office today for placement of Mirena IUD. The patient had previously been provided with literature information on this method of contraception. The risks benefits and pros and cons were discussed and all her questions were answered. She is fully aware that this form of contraception is 99% effective and is good for 5 years.  Pelvic exam:  Vulva normal Bartholin urethra Skene glands: Within normal limits Vagina: No lesions or discharge Cervix: No lesions or discharge Uterus: AV position, normal size, NT Adnexa: No masses or tenderness Rectal exam: Not done  The cervix was cleansed with Betadine solution. [name] spray on cervix.  A single-tooth tenaculum was placed on the anterior cervical lip. The IUD was shown to the patient, then inserted in a sterile fashion in the uterus, sounded to 7 centimeter with the IUD. The IUD string was trimmed. The single-tooth tenaculum was removed.  Silver Nitrate applied on cervix to control  mild bleeding at site of tenaculum. Patient was instructed to return back to the office in one month for follow up.       Assessment/Plan:  41 y.o. G4P0013   1. Encounter for other general counseling or advice on contraception Counseling on contraception.  Decision to proceed with Mirena IUD insertion today.  Patient on her period.  Recent STI screen neg.  2. Encounter for insertion of mirena IUD Insertion of Mirena IUD as described above.  F/U in 4 wks for IUD check.  3. Low grade squamous intraepith lesion on cytologic smear cervix (lgsil) Will repeat Pap/HPV HR at f/u in 1 month (6 month repeat Pap).  If abnormal again, will proceed with Colposcopy.  4. 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  Counseling on above issues >50% x 15 minutes.  Hannah DelMarie-Lyne Dontavius Keim MD, 1:11 PM 06/23/2017

## 2017-06-23 NOTE — Patient Instructions (Signed)
1. Encounter for other general counseling or advice on contraception Counseling on contraception.  Decision to proceed with Mirena IUD insertion today.  Patient on her period.  Recent STI screen neg.  2. Encounter for insertion of mirena IUD Insertion of Mirena IUD as described above.  F/U in 4 wks for IUD check.  3. Low grade squamous intraepith lesion on cytologic smear cervix (lgsil) Will repeat Pap/HPV HR at f/u in 1 month (6 month repeat Pap).  If abnormal again, will proceed with Colposcopy.  4. 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  Hannah Sanders, it was a pleasure to see you today!  I will see you again soon.

## 2017-06-24 ENCOUNTER — Encounter: Payer: Self-pay | Admitting: Anesthesiology

## 2017-07-10 ENCOUNTER — Other Ambulatory Visit: Payer: 59

## 2017-07-22 ENCOUNTER — Ambulatory Visit
Admission: RE | Admit: 2017-07-22 | Discharge: 2017-07-22 | Disposition: A | Discharge status: Home / Self Care | Payer: 59 | Source: Ambulatory Visit | Attending: Obstetrics and Gynecology | Admitting: Obstetrics and Gynecology

## 2017-07-22 ENCOUNTER — Ambulatory Visit: Admission: RE | Admit: 2017-07-22 | Payer: 59 | Source: Ambulatory Visit

## 2017-07-22 DIAGNOSIS — R234 Changes in skin texture: Secondary | ICD-10-CM

## 2018-02-24 ENCOUNTER — Inpatient Hospital Stay (HOSPITAL_COMMUNITY)
Admission: EM | Admit: 2018-02-24 | Discharge: 2018-03-04 | DRG: 862 | Disposition: A | Discharge status: Home / Self Care | Payer: 59 | Attending: Family Medicine | Admitting: Family Medicine

## 2018-02-24 ENCOUNTER — Emergency Department (HOSPITAL_COMMUNITY): Payer: 59

## 2018-02-24 DIAGNOSIS — I10 Essential (primary) hypertension: Secondary | ICD-10-CM | POA: Diagnosis present

## 2018-02-24 DIAGNOSIS — D500 Iron deficiency anemia secondary to blood loss (chronic): Secondary | ICD-10-CM | POA: Diagnosis present

## 2018-02-24 DIAGNOSIS — D649 Anemia, unspecified: Secondary | ICD-10-CM

## 2018-02-24 DIAGNOSIS — T368X5A Adverse effect of other systemic antibiotics, initial encounter: Secondary | ICD-10-CM | POA: Diagnosis not present

## 2018-02-24 DIAGNOSIS — R5082 Postprocedural fever: Secondary | ICD-10-CM

## 2018-02-24 DIAGNOSIS — A419 Sepsis, unspecified organism: Secondary | ICD-10-CM | POA: Diagnosis present

## 2018-02-24 DIAGNOSIS — G4733 Obstructive sleep apnea (adult) (pediatric): Secondary | ICD-10-CM | POA: Diagnosis present

## 2018-02-24 DIAGNOSIS — Z79899 Other long term (current) drug therapy: Secondary | ICD-10-CM

## 2018-02-24 DIAGNOSIS — R51 Headache: Secondary | ICD-10-CM | POA: Diagnosis present

## 2018-02-24 DIAGNOSIS — T8140XA Infection following a procedure, unspecified, initial encounter: Secondary | ICD-10-CM | POA: Diagnosis not present

## 2018-02-24 DIAGNOSIS — Z8619 Personal history of other infectious and parasitic diseases: Secondary | ICD-10-CM

## 2018-02-24 DIAGNOSIS — Z91013 Allergy to seafood: Secondary | ICD-10-CM

## 2018-02-24 DIAGNOSIS — L03317 Cellulitis of buttock: Secondary | ICD-10-CM | POA: Diagnosis present

## 2018-02-24 DIAGNOSIS — N179 Acute kidney failure, unspecified: Secondary | ICD-10-CM

## 2018-02-24 DIAGNOSIS — L0231 Cutaneous abscess of buttock: Secondary | ICD-10-CM

## 2018-02-24 HISTORY — DX: Inflammatory liver disease, unspecified: K75.9

## 2018-02-24 LAB — COMPREHENSIVE METABOLIC PANEL
ALK PHOS: 138 U/L — AB (ref 38–126)
ALT: 63 U/L — ABNORMAL HIGH (ref 14–54)
AST: 31 U/L (ref 15–41)
Albumin: 3.4 g/dL — ABNORMAL LOW (ref 3.5–5.0)
Anion gap: 10 (ref 5–15)
BILIRUBIN TOTAL: 1 mg/dL (ref 0.3–1.2)
BUN: 8 mg/dL (ref 6–20)
CALCIUM: 8.6 mg/dL — AB (ref 8.9–10.3)
CO2: 26 mmol/L (ref 22–32)
Chloride: 101 mmol/L (ref 101–111)
Creatinine, Ser: 0.91 mg/dL (ref 0.44–1.00)
GFR calc Af Amer: >60 mL/min (ref >60)
GLUCOSE: 96 mg/dL (ref 65–99)
POTASSIUM: 3.7 mmol/L (ref 3.5–5.1)
Sodium: 137 mmol/L (ref 135–145)
TOTAL PROTEIN: 7.6 g/dL (ref 6.5–8.1)

## 2018-02-24 LAB — URINALYSIS, ROUTINE W REFLEX MICROSCOPIC
Bilirubin Urine: NEGATIVE
GLUCOSE, UA: NEGATIVE mg/dL
KETONES UR: NEGATIVE mg/dL
Nitrite: NEGATIVE
PH: 7 (ref 5.0–8.0)
Protein, ur: NEGATIVE mg/dL
Specific Gravity, Urine: 1.016 (ref 1.005–1.030)

## 2018-02-24 LAB — CBC WITH DIFFERENTIAL/PLATELET
BASOS ABS: 0 10*3/uL (ref 0.0–0.1)
BASOS PCT: 0 %
EOS ABS: 0.1 10*3/uL (ref 0.0–0.7)
EOS PCT: 1 %
HCT: 26.2 % — ABNORMAL LOW (ref 36.0–46.0)
Hemoglobin: 8.6 g/dL — ABNORMAL LOW (ref 12.0–15.0)
LYMPHS PCT: 15 %
Lymphs Abs: 1.6 10*3/uL (ref 0.7–4.0)
MCH: 30.9 pg (ref 26.0–34.0)
MCHC: 32.8 g/dL (ref 30.0–36.0)
MCV: 94.2 fL (ref 78.0–100.0)
Monocytes Absolute: 1 10*3/uL (ref 0.1–1.0)
Monocytes Relative: 9 %
Neutro Abs: 8.5 10*3/uL — ABNORMAL HIGH (ref 1.7–7.7)
Neutrophils Relative %: 75 %
PLATELETS: 373 10*3/uL (ref 150–400)
RBC: 2.78 MIL/uL — ABNORMAL LOW (ref 3.87–5.11)
RDW: 14.7 % (ref 11.5–15.5)
WBC: 11.2 10*3/uL — AB (ref 4.0–10.5)

## 2018-02-24 LAB — PROTIME-INR
INR: 1.11
PROTHROMBIN TIME: 14.2 s (ref 11.4–15.2)

## 2018-02-24 LAB — POC OCCULT BLOOD, ED: FECAL OCCULT BLD: POSITIVE — AB

## 2018-02-24 LAB — I-STAT CG4 LACTIC ACID, ED: Lactic Acid, Venous: 1.67 mmol/L (ref 0.5–1.9)

## 2018-02-24 MED ORDER — ACETAMINOPHEN 325 MG PO TABS
650.0000 mg | ORAL_TABLET | Freq: Once | ORAL | Status: AC
  Administered 2018-02-24: 650 mg via ORAL
  Filled 2018-02-24: qty 2

## 2018-02-24 MED ORDER — SODIUM CHLORIDE 0.9 % IV BOLUS
500.0000 mL | Freq: Once | INTRAVENOUS | Status: AC
  Administered 2018-02-24: 500 mL via INTRAVENOUS

## 2018-02-24 NOTE — ED Provider Notes (Signed)
[facility]-EMERGENCY DEPT Provider Note   CSN: [id number] Arrival date & time: 02/24/18  1648     History   Chief Complaint Chief Complaint  Patient presents with  . Abnormal Lab    HGB 8.5    HPI Hannah Sanders is a 42 y.o. female.  HPI   Hannah Sanders is a 42 y.o. female, with a history of Hep C and HTN, presenting to the ED with fever, generalized weakness, body aches, and generally feeling unwell since March 21.  Patient underwent Sudan Butt Lift (BBL) surgery and liposuction on March 18 in [place], [place] (POD 9).  This involved transferring fat from her back into her buttocks.  She has been taking Keflex since March 19. She has had no difficulty with bowel movements.   Preop hemoglobin was 13.6 on 01/21/18.  Patient was seen at [facility] urgent care earlier today and hemoglobin was found to be 8.5.  She notes that there were no drains placed after her surgery.  Instead, she would come in for daily massages of the areas around the incision sites.  She notes there was "a lot of blood" that came out from the sites the first few days, including bleeding onto the floor in a puddle. LMP 01/25/2018.  States she does not have heavy menstrual cycles. Denies abdominal pain, hematuria/dysuria, N/V/C/D, hematochezia/melena, pain, redness, or swelling at the incision sites, cough, shortness of breath, chest pain, or any other complaints.    Past Medical History:  Diagnosis Date  . Allergy   . Depression   . Hypertension   . Liver disease    Diagnosed With Hep C    Patient Active Problem List   Diagnosis Date Noted  . Breast enlargement 03/11/2016  . OSA (obstructive sleep apnea) 02/02/2015  . Severe obesity (BMI >= 40) (HCC) 02/02/2015    Past Surgical History:  Procedure Laterality Date  . ABDOMINAL SURGERY  05/2016   ABDOMINOPLASTY   . BREAST SURGERY  05/2016   BREAST REDUCTION  . CHOLECYSTECTOMY, LAPAROSCOPIC       OB History    Gravida   4   Para  3   Term      Preterm      AB  1   Living  3     SAB      TAB      Ectopic      Multiple      Live Births               Home Medications    Prior to Admission medications   Medication Sig Start Date End Date Taking? Authorizing Provider  cephALEXin (KEFLEX) 500 MG capsule Take 500 mg by mouth 4 (four) times daily. 02/23/18  Yes [provider]  hydrochlorothiazide (HYDRODIURIL) 25 MG tablet Take 25 mg by mouth daily.   Yes [provider]  HYDROcodone-acetaminophen (NORCO) 10-325 MG tablet Take 1 tablet by mouth every 6 (six) hours as needed for moderate pain.  02/16/18  Yes [provider]    Family History Family History  Problem Relation Age of Onset  . Heart disease Mother   . Hypertension Mother   . Diabetes Mother   . Heart disease Father   . Hypertension Father   . Diabetes Father   . Heart disease Brother   . Diabetes Daughter   . Heart disease Maternal Grandmother   . Hypertension Maternal Grandmother   . Diabetes Maternal Grandfather   .  Diabetes Paternal Grandmother   . Diabetes Maternal Aunt   . Diabetes Maternal Uncle   . Diabetes Paternal Aunt   . Diabetes Paternal Grandfather     Social History Social History   Tobacco Use  . Smoking status: Never Smoker  . Smokeless tobacco: Never Used  Substance Use Topics  . Alcohol use: Yes    Comment: SOCIAL  . Drug use: No     Allergies   Shellfish-derived products   Review of Systems Review of Systems  Constitutional: Positive for fatigue and fever.  Respiratory: Negative for cough and shortness of breath.   Cardiovascular: Negative for chest pain and leg swelling.  Gastrointestinal: Negative for abdominal pain, blood in stool, diarrhea, nausea and vomiting.  Genitourinary: Negative for difficulty urinating, flank pain, hematuria and vaginal bleeding.  Musculoskeletal: Negative for back pain.  Neurological: Positive for weakness and headaches.  Negative for syncope.  All other systems reviewed and are negative.    Physical Exam Updated Vital Signs BP 128/75 (BP Location: Left Arm)   Pulse (!) 101   Temp (!) 100.8 F (38.2 C) (Oral)   Resp 18   LMP 01/25/2018   SpO2 100%   Physical Exam  Constitutional: She appears well-developed and well-nourished. No distress.  HENT:  Head: Normocephalic and atraumatic.  Eyes: Conjunctivae are normal.  Neck: Neck supple.  Cardiovascular: Normal rate, regular rhythm, normal heart sounds and intact distal pulses.  Pulmonary/Chest: Effort normal and breath sounds normal. No respiratory distress.  Abdominal: Soft. There is no tenderness. There is no guarding.  No ecchymosis noted  Genitourinary: Rectal exam shows guaiac positive stool.  Genitourinary Comments: No external hemorrhoids, fissures, or lesions noted. No gross blood or stool burden. No rectal tenderness. Med Tech served as chaperone during the rectal exam.  Musculoskeletal: She exhibits no edema.  Lymphadenopathy:    She has no cervical adenopathy.  Neurological: She is alert.  Skin: Skin is warm and dry. She is not diaphoretic.  Patient has multiple small surgical incisions to the mid back, lower back, buttocks, abdomen, and posterior upper legs just inferior to the gluteal clefts bilaterally. No noted swelling, erythema, or exudate.  No active hemorrhage noted.  No surrounding ecchymosis.  No noted dehiscence.  Appropriately sore to the touch, but not exquisitely tender.  Psychiatric: She has a normal mood and affect. Her behavior is normal.  Nursing note and vitals reviewed.    ED Treatments / Results  Labs (all labs ordered are listed, but only abnormal results are displayed) Labs Reviewed  COMPREHENSIVE METABOLIC PANEL - Abnormal; Notable for the following components:      Result Value   Calcium 8.6 (*)    Albumin 3.4 (*)    ALT 63 (*)    Alkaline Phosphatase 138 (*)    All other components within normal limits    CBC WITH DIFFERENTIAL/PLATELET - Abnormal; Notable for the following components:   WBC 11.2 (*)    RBC 2.78 (*)    Hemoglobin 8.6 (*)    HCT 26.2 (*)    Neutro Abs 8.5 (*)    All other components within normal limits  URINALYSIS, ROUTINE W REFLEX MICROSCOPIC - Abnormal; Notable for the following components:   Hgb urine dipstick MODERATE (*)    Leukocytes, UA LARGE (*)    Bacteria, UA FEW (*)    Squamous Epithelial / LPF 6-30 (*)    All other components within normal limits  POC OCCULT BLOOD, ED - Abnormal; Notable for the  following components:   Fecal Occult Bld POSITIVE (*)    All other components within normal limits  CULTURE, BLOOD (ROUTINE X 2)  CULTURE, BLOOD (ROUTINE X 2)  URINE CULTURE  PROTIME-INR  PREGNANCY, URINE  I-STAT BETA HCG BLOOD, ED ([facility], [facility], [facility] ONLY)  I-STAT CG4 LACTIC ACID, ED  I-STAT CG4 LACTIC ACID, ED  TYPE AND SCREEN  ABO/RH    EKG None  Radiology Dg Chest 2 View  Result Date: 02/24/2018 CLINICAL DATA:  Fever for 3 days, status post liposuction. EXAM: CHEST - 2 VIEW COMPARISON:  Chest radiograph February 27, 2016 FINDINGS: Cardiomediastinal silhouette is normal. No pleural effusions or focal consolidations. Trachea projects midline and there is no pneumothorax. Soft tissue planes and included osseous structures are non-suspicious. Surgical clips in the included right abdomen compatible with cholecystectomy. IMPRESSION: Negative. Electronically Signed   By: Awilda Metroourtnay  Bloomer M.D.   On: 02/24/2018 22:12    Procedures Procedures (including critical care time)  Medications Ordered in ED Medications  sodium chloride 0.9 % bolus 500 mL (500 mLs Intravenous New Bag/Given 02/24/18 2309)  acetaminophen (TYLENOL) tablet 650 mg (has no administration in time range)     Initial Impression / Assessment and Plan / ED Course  I have reviewed the triage vital signs and the nursing notes.  Pertinent labs & imaging results that were available during my care of the  patient were reviewed by me and considered in my medical decision making (see chart for details).     Patient presents with generalized weakness, body aches, and overall feeling unwell in the setting of postop fever and anemia.  She is Hemoccult positive and has hemoglobin in the urine.  She has no abdominal tenderness and is appropriately tender at the incision sites. No thrombocytopenia or prolonged PT reduces suspicion for DIC.    End of shift patient handoff report given to Mathews RobinsonsJessica Mitchell, PA-C. Plan: CT pending. Admit following results.   Findings and plan of care discussed with Rolan BuccoMelanie Belfi, MD.   Vitals:   02/24/18 1700 02/24/18 2048 02/24/18 2245  BP: 128/75 126/78 123/74  Pulse: (!) 101 98 87  Resp: 18 18 17   Temp: (!) 100.8 F (38.2 C) 99.5 F (37.5 C) 99.1 F (37.3 C)  TempSrc: Oral Oral Oral  SpO2: 100% 100% 100%     Final Clinical Impressions(s) / ED Diagnoses   Final diagnoses:  None    ED Discharge Orders    None       Concepcion LivingJoy, Shawn C, PA-C 02/25/18 0004    Rolan BuccoBelfi, Melanie, MD 03/02/18 [phone]

## 2018-02-24 NOTE — ED Triage Notes (Signed)
Pt sent here from urgent care for Hgb of 8.5, down from 13.6. Pt states she feels weak. Pt had liposuction on 3/18

## 2018-02-24 NOTE — ED Provider Notes (Signed)
Post [place] b[place] butt[place], started fever last Thursday and feeling ill.  [facility] Feeling fatigued and ill, denies any pain.  Ct abdomen and pelvis pending.  Hemoccult positive, febrile  Plan to admit  CT without acute abnormality. Given fluids. On my reassessment, her temp was 99.4 at 5:20 am. She is comfortable and sleeping. No chest pain or sob. She reports a headache requesting tylenol.  Will call for admission Spoke to Dr. Katrinka BlazingSmith who will admit patient.   Georgiana ShoreMitchell, Charletta Voight B, PA-C 02/25/18 0549    Terrilee FilesButler, Michael C, MD 02/25/18 [phone]

## 2018-02-25 ENCOUNTER — Other Ambulatory Visit: Payer: Self-pay

## 2018-02-25 ENCOUNTER — Emergency Department (HOSPITAL_COMMUNITY): Payer: 59

## 2018-02-25 ENCOUNTER — Encounter (HOSPITAL_COMMUNITY): Payer: Self-pay

## 2018-02-25 DIAGNOSIS — A419 Sepsis, unspecified organism: Secondary | ICD-10-CM | POA: Diagnosis present

## 2018-02-25 DIAGNOSIS — N179 Acute kidney failure, unspecified: Secondary | ICD-10-CM | POA: Diagnosis not present

## 2018-02-25 DIAGNOSIS — T8140XA Infection following a procedure, unspecified, initial encounter: Secondary | ICD-10-CM | POA: Diagnosis present

## 2018-02-25 DIAGNOSIS — L0231 Cutaneous abscess of buttock: Secondary | ICD-10-CM | POA: Diagnosis not present

## 2018-02-25 DIAGNOSIS — I10 Essential (primary) hypertension: Secondary | ICD-10-CM | POA: Diagnosis present

## 2018-02-25 DIAGNOSIS — D649 Anemia, unspecified: Secondary | ICD-10-CM | POA: Diagnosis not present

## 2018-02-25 DIAGNOSIS — R710 Precipitous drop in hematocrit: Secondary | ICD-10-CM

## 2018-02-25 DIAGNOSIS — R51 Headache: Secondary | ICD-10-CM | POA: Diagnosis present

## 2018-02-25 DIAGNOSIS — R195 Other fecal abnormalities: Secondary | ICD-10-CM | POA: Diagnosis not present

## 2018-02-25 DIAGNOSIS — L03317 Cellulitis of buttock: Secondary | ICD-10-CM | POA: Diagnosis present

## 2018-02-25 DIAGNOSIS — T368X5A Adverse effect of other systemic antibiotics, initial encounter: Secondary | ICD-10-CM | POA: Diagnosis not present

## 2018-02-25 DIAGNOSIS — D500 Iron deficiency anemia secondary to blood loss (chronic): Secondary | ICD-10-CM | POA: Diagnosis present

## 2018-02-25 DIAGNOSIS — G4733 Obstructive sleep apnea (adult) (pediatric): Secondary | ICD-10-CM | POA: Diagnosis present

## 2018-02-25 DIAGNOSIS — R5082 Postprocedural fever: Secondary | ICD-10-CM | POA: Diagnosis not present

## 2018-02-25 DIAGNOSIS — Z8619 Personal history of other infectious and parasitic diseases: Secondary | ICD-10-CM | POA: Diagnosis not present

## 2018-02-25 DIAGNOSIS — Z79899 Other long term (current) drug therapy: Secondary | ICD-10-CM | POA: Diagnosis not present

## 2018-02-25 DIAGNOSIS — Z91013 Allergy to seafood: Secondary | ICD-10-CM | POA: Diagnosis not present

## 2018-02-25 LAB — COMPREHENSIVE METABOLIC PANEL
ALT: 53 U/L (ref 14–54)
ANION GAP: 12 (ref 5–15)
AST: 30 U/L (ref 15–41)
Albumin: 2.8 g/dL — ABNORMAL LOW (ref 3.5–5.0)
Alkaline Phosphatase: 130 U/L — ABNORMAL HIGH (ref 38–126)
BUN: 8 mg/dL (ref 6–20)
CHLORIDE: 103 mmol/L (ref 101–111)
CO2: 25 mmol/L (ref 22–32)
Calcium: 8.5 mg/dL — ABNORMAL LOW (ref 8.9–10.3)
Creatinine, Ser: 0.97 mg/dL (ref 0.44–1.00)
Glucose, Bld: 111 mg/dL — ABNORMAL HIGH (ref 65–99)
Potassium: 3.5 mmol/L (ref 3.5–5.1)
Sodium: 140 mmol/L (ref 135–145)
Total Bilirubin: 1.2 mg/dL (ref 0.3–1.2)
Total Protein: 6.9 g/dL (ref 6.5–8.1)

## 2018-02-25 LAB — CBC
HCT: 24.8 % — ABNORMAL LOW (ref 36.0–46.0)
Hemoglobin: 8.2 g/dL — ABNORMAL LOW (ref 12.0–15.0)
MCH: 30.9 pg (ref 26.0–34.0)
MCHC: 33.1 g/dL (ref 30.0–36.0)
MCV: 93.6 fL (ref 78.0–100.0)
PLATELETS: 353 10*3/uL (ref 150–400)
RBC: 2.65 MIL/uL — ABNORMAL LOW (ref 3.87–5.11)
RDW: 14.6 % (ref 11.5–15.5)
WBC: 9.9 10*3/uL (ref 4.0–10.5)

## 2018-02-25 LAB — IRON AND TIBC
Iron: 17 ug/dL — ABNORMAL LOW (ref 28–170)
SATURATION RATIOS: 9 % — AB (ref 10.4–31.8)
TIBC: 188 ug/dL — ABNORMAL LOW (ref 250–450)
UIBC: 171 ug/dL

## 2018-02-25 LAB — DIC (DISSEMINATED INTRAVASCULAR COAGULATION) PANEL
D DIMER QUANT: 2.09 ug{FEU}/mL — AB (ref 0.00–0.50)
INR: 1.18
PLATELETS: 347 10*3/uL (ref 150–400)

## 2018-02-25 LAB — DIC (DISSEMINATED INTRAVASCULAR COAGULATION)PANEL
Prothrombin Time: 14.9 seconds (ref 11.4–15.2)
Smear Review: NONE SEEN
aPTT: 36 seconds (ref 24–36)

## 2018-02-25 LAB — PROTIME-INR
INR: 1.18
PROTHROMBIN TIME: 14.9 s (ref 11.4–15.2)

## 2018-02-25 LAB — HEMOGLOBIN AND HEMATOCRIT, BLOOD
HEMATOCRIT: 24.8 % — AB (ref 36.0–46.0)
HEMOGLOBIN: 8.2 g/dL — AB (ref 12.0–15.0)

## 2018-02-25 LAB — ABO/RH: ABO/RH(D): O POS

## 2018-02-25 LAB — APTT: APTT: 33 s (ref 24–36)

## 2018-02-25 LAB — FERRITIN: Ferritin: 202 ng/mL (ref 11–307)

## 2018-02-25 LAB — PREGNANCY, URINE: PREG TEST UR: NEGATIVE

## 2018-02-25 MED ORDER — VANCOMYCIN HCL IN DEXTROSE 1-5 GM/200ML-% IV SOLN
1000.0000 mg | Freq: Once | INTRAVENOUS | Status: AC
  Administered 2018-02-25: 1000 mg via INTRAVENOUS
  Filled 2018-02-25: qty 200

## 2018-02-25 MED ORDER — HYDROCODONE-ACETAMINOPHEN 10-325 MG PO TABS
1.0000 | ORAL_TABLET | Freq: Once | ORAL | Status: AC
  Administered 2018-02-25: 1 via ORAL
  Filled 2018-02-25: qty 1

## 2018-02-25 MED ORDER — IOPAMIDOL (ISOVUE-300) INJECTION 61%
100.0000 mL | Freq: Once | INTRAVENOUS | Status: AC | PRN
  Administered 2018-02-25: 100 mL via INTRAVENOUS

## 2018-02-25 MED ORDER — VANCOMYCIN HCL 10 G IV SOLR
1250.0000 mg | INTRAVENOUS | Status: DC
  Administered 2018-02-25 – 2018-02-26 (×2): 1250 mg via INTRAVENOUS
  Filled 2018-02-25 (×3): qty 1250

## 2018-02-25 MED ORDER — PIPERACILLIN-TAZOBACTAM 3.375 G IVPB
3.3750 g | Freq: Three times a day (TID) | INTRAVENOUS | Status: DC
  Administered 2018-02-25 – 2018-03-03 (×20): 3.375 g via INTRAVENOUS
  Filled 2018-02-25 (×23): qty 50

## 2018-02-25 MED ORDER — SODIUM CHLORIDE 0.9 % IV BOLUS
1000.0000 mL | Freq: Once | INTRAVENOUS | Status: AC
  Administered 2018-02-25: 1000 mL via INTRAVENOUS

## 2018-02-25 MED ORDER — PIPERACILLIN-TAZOBACTAM 3.375 G IVPB 30 MIN
3.3750 g | Freq: Once | INTRAVENOUS | Status: AC
  Administered 2018-02-25: 3.375 g via INTRAVENOUS
  Filled 2018-02-25: qty 50

## 2018-02-25 MED ORDER — IOPAMIDOL (ISOVUE-300) INJECTION 61%
INTRAVENOUS | Status: AC
  Administered 2018-02-25: 01:00:00
  Filled 2018-02-25: qty 100

## 2018-02-25 MED ORDER — SODIUM CHLORIDE 0.9 % IV SOLN
INTRAVENOUS | Status: DC
  Administered 2018-02-25 – 2018-02-28 (×3): via INTRAVENOUS

## 2018-02-25 MED ORDER — ACETAMINOPHEN 325 MG PO TABS
650.0000 mg | ORAL_TABLET | Freq: Four times a day (QID) | ORAL | Status: DC | PRN
  Administered 2018-02-25 – 2018-02-27 (×6): 650 mg via ORAL
  Filled 2018-02-25 (×6): qty 2

## 2018-02-25 NOTE — ED Notes (Signed)
ED TO INPATIENT HANDOFF REPORT  Name/Age/Gender Hannah Sanders 42 y.o. female  Code Status   Home/SNF/Other Home  Chief Complaint need blood transfusion  Level of Care/Admitting Diagnosis ED Disposition    ED Disposition Condition [facility] Area: [place] [[id number]]  Level of Care: Telemetry [5]  Admit to tele based on following criteria: Monitor for Ischemic changes  Diagnosis: Sepsis [facility]) [[id number]]  Admitting Physician: Norval Morton [[id number]]  Attending Physician: Norval Morton [[id number]]  Estimated length of stay: past midnight tomorrow  Certification:: I certify this patient will need inpatient services for at least 2 midnights  PT Class (Do Not Modify): Inpatient [101]  PT Acc Code (Do Not Modify): Private [1]       Medical History Past Medical History:  Diagnosis Date  . Allergy   . Depression   . Hypertension   . Liver disease    Diagnosed With Hep C    Allergies Allergies  Allergen Reactions  . Shellfish-Derived Products Swelling    IV Location/Drains/Wounds Patient Lines/Drains/Airways Status   Active Line/Drains/Airways    Name:   Placement date:   Placement time:   Site:   Days:   Peripheral IV 02/25/18 Right Antecubital   02/25/18    0045    Antecubital   less than 1          Labs/Imaging Results for orders placed or performed during the hospital encounter of 02/24/18 (from the past 48 hour(s))  Comprehensive metabolic panel     Status: Abnormal   Collection Time: 02/24/18  9:10 PM  Result Value Ref Range   Sodium 137 135 - 145 mmol/L   Potassium 3.7 3.5 - 5.1 mmol/L   Chloride 101 101 - 111 mmol/L   CO2 26 22 - 32 mmol/L   Glucose, Bld 96 65 - 99 mg/dL   BUN 8 6 - 20 mg/dL   Creatinine, Ser 0.91 0.44 - 1.00 mg/dL   Calcium 8.6 (L) 8.9 - 10.3 mg/dL   Total Protein 7.6 6.5 - 8.1 g/dL   Albumin 3.4 (L) 3.5 - 5.0 g/dL   AST 31 15 - 41 U/L   ALT 63 (H) 14 - 54 U/L   Alkaline  Phosphatase 138 (H) 38 - 126 U/L   Total Bilirubin 1.0 0.3 - 1.2 mg/dL   GFR calc non Af Amer >60 >60 mL/min   GFR calc Af Amer >60 >60 mL/min    Comment: (NOTE) The eGFR has been calculated using the CKD EPI equation. This calculation has not been validated in all clinical situations. eGFR's persistently <60 mL/min signify possible Chronic Kidney Disease.    Anion gap 10 5 - 15    Comment: Performed at [facility], [place] [address] [place], [place] [address]  CBC with Differential     Status: Abnormal   Collection Time: 02/24/18  9:10 PM  Result Value Ref Range   WBC 11.2 (H) 4.0 - 10.5 K/uL   RBC 2.78 (L) 3.87 - 5.11 MIL/uL   Hemoglobin 8.6 (L) 12.0 - 15.0 g/dL   HCT 26.2 (L) 36.0 - 46.0 %   MCV 94.2 78.0 - 100.0 fL   MCH 30.9 26.0 - 34.0 pg   MCHC 32.8 30.0 - 36.0 g/dL   RDW 14.7 11.5 - 15.5 %   Platelets 373 150 - 400 K/uL   Neutrophils Relative % 75 %   Neutro Abs 8.5 (H) 1.7 - 7.7 K/uL  Lymphocytes Relative 15 %   Lymphs Abs 1.6 0.7 - 4.0 K/uL   Monocytes Relative 9 %   Monocytes Absolute 1.0 0.1 - 1.0 K/uL   Eosinophils Relative 1 %   Eosinophils Absolute 0.1 0.0 - 0.7 K/uL   Basophils Relative 0 %   Basophils Absolute 0.0 0.0 - 0.1 K/uL    Comment: Performed at [facility], [place] [address], [place], [place] [address]  Type and screen [place]     Status: None   Collection Time: 02/24/18  9:10 PM  Result Value Ref Range   ABO/RH(D) O POS    Antibody Screen NEG    Sample Expiration      02/27/2018 Performed at [facility], [place] [address] [place], [place] [address]   Urinalysis, Routine w reflex microscopic     Status: Abnormal   Collection Time: 02/24/18  9:10 PM  Result Value Ref Range   Color, Urine YELLOW YELLOW   APPearance CLEAR CLEAR   Specific Gravity, Urine 1.016 1.005 - 1.030   pH 7.0 5.0 - 8.0   Glucose, UA NEGATIVE NEGATIVE mg/dL   Hgb urine dipstick MODERATE (A)  NEGATIVE   Bilirubin Urine NEGATIVE NEGATIVE   Ketones, ur NEGATIVE NEGATIVE mg/dL   Protein, ur NEGATIVE NEGATIVE mg/dL   Nitrite NEGATIVE NEGATIVE   Leukocytes, UA LARGE (A) NEGATIVE   RBC / HPF 0-5 0 - 5 RBC/hpf   WBC, UA 6-30 0 - 5 WBC/hpf   Bacteria, UA FEW (A) NONE SEEN   Squamous Epithelial / LPF 6-30 (A) NONE SEEN   Mucus PRESENT     Comment: Performed at [facility], [place] [address] [place], [place] [address]  Protime-INR     Status: None   Collection Time: 02/24/18  9:26 PM  Result Value Ref Range   Prothrombin Time 14.2 11.4 - 15.2 seconds   INR 1.11     Comment: Performed at [facility], [place] [address], [place], [place] [address]  I-Stat CG4 Lactic Acid, ED     Status: None   Collection Time: 02/24/18 10:22 PM  Result Value Ref Range   Lactic Acid, Venous 1.67 0.5 - 1.9 mmol/L  Pregnancy, urine     Status: None   Collection Time: 02/24/18 10:42 PM  Result Value Ref Range   Preg Test, Ur NEGATIVE NEGATIVE    Comment:        THE SENSITIVITY OF THIS METHODOLOGY IS >20 mIU/mL. Performed at [facility], [place] [address], [place], [place] [address]   POC occult blood, ED Provider will collect     Status: Abnormal   Collection Time: 02/24/18 10:59 PM  Result Value Ref Range   Fecal Occult Bld POSITIVE (A) NEGATIVE  Hemoglobin and hematocrit, blood     Status: Abnormal   Collection Time: 02/25/18  5:22 AM  Result Value Ref Range   Hemoglobin 8.2 (L) 12.0 - 15.0 g/dL   HCT 24.8 (L) 36.0 - 46.0 %    Comment: Performed at [facility], [place] [address], [place], [place] [address]   Dg Chest 2 View  Result Date: 02/24/2018 CLINICAL DATA:  Fever for 3 days, status post liposuction. EXAM: CHEST - 2 VIEW COMPARISON:  Chest radiograph February 27, 2016 FINDINGS: Cardiomediastinal silhouette is normal. No pleural effusions or focal consolidations. Trachea projects midline and there is no pneumothorax. Soft  tissue planes and included osseous structures are non-suspicious. Surgical clips  in the included right abdomen compatible with cholecystectomy. IMPRESSION: Negative. Electronically Signed   By: Elon Alas M.D.   On: 02/24/2018 22:12   Ct Abdomen Pelvis W Contrast  Result Date: 02/25/2018 CLINICAL DATA:  42 year old female with abdominal pain. Body aches and fever. Status post [place] and liposuction. EXAM: CT ABDOMEN AND PELVIS WITH CONTRAST TECHNIQUE: Multidetector CT imaging of the abdomen and pelvis was performed using the standard protocol following bolus administration of intravenous contrast. CONTRAST:  194m ISOVUE-300 IOPAMIDOL (ISOVUE-300) INJECTION 61% COMPARISON:  None. FINDINGS: Lower chest: The visualized lung bases are clear. No intra-abdominal free air or free fluid. Hepatobiliary: There is apparent mild fatty infiltration of the liver. No intrahepatic biliary ductal dilatation. Cholecystectomy. Pancreas: Unremarkable. No pancreatic ductal dilatation or surrounding inflammatory changes. Spleen: Normal in size without focal abnormality. Adrenals/Urinary Tract: Adrenal glands are unremarkable. Kidneys are normal, without renal calculi, focal lesion, or hydronephrosis. Bladder is unremarkable. Stomach/Bowel: Stomach is within normal limits. Appendix appears normal. No evidence of bowel wall thickening, distention, or inflammatory changes. Vascular/Lymphatic: No significant vascular findings are present. No enlarged abdominal or pelvic lymph nodes. Reproductive: The uterus is anteverted. An intrauterine device is noted. The ovaries are grossly unremarkable as visualized. No pelvic mass. Other: There is diffuse stranding and edema of the subcutaneous soft tissues of the abdomen related to recent procedure. Scattered areas of subcutaneous fat necrosis as well as pockets of subcutaneous soft tissue air noted. There is no drainable fluid collection or abscess. Musculoskeletal:  Degenerative changes primarily at L3-L4 with endplate irregularity and disc desiccation and vacuum phenomena. No acute osseous pathology. IMPRESSION: 1. No acute intra-abdominal or pelvic pathology. 2. Inflammatory changes and stranding of the subcutaneous soft tissues of the abdomen and pelvis with areas of fat necrosis related to recent procedure. No drainable fluid collection or abscess. Electronically Signed   By: AAnner CreteM.D.   On: 02/25/2018 03:00    Pending Labs Unresulted Labs (From admission, onward)   Start     Ordered   02/24/18 2210  ABO/Rh  Once,   R     02/24/18 2210   02/24/18 2115  Culture, blood (routine x 2)  BLOOD CULTURE X 2,   STAT     02/24/18 2114   02/24/18 2115  Urine culture  STAT,   STAT     02/24/18 2115      Vitals/Pain Today's Vitals   02/25/18 0430 02/25/18 0500 02/25/18 0607 02/25/18 0611  BP: 110/71 109/79 110/65   Pulse: 87 93 87   Resp: (!) '22 20 16   ' Temp:   99.1 F (37.3 C)   TempSrc:   Oral   SpO2: 97% 98% 99%   Weight:    219 lb (99.3 kg)  Height:    '5\' 4"'  (1.626 m)  PainSc:   6      Isolation Precautions No active isolations  Medications Medications  sodium chloride 0.9 % bolus 1,000 mL (1,000 mLs Intravenous New Bag/Given 02/25/18 0533)  piperacillin-tazobactam (ZOSYN) IVPB 3.375 g (3.375 g Intravenous New Bag/Given 02/25/18 0606)  vancomycin (VANCOCIN) IVPB 1000 mg/200 mL premix (has no administration in time range)  0.9 %  sodium chloride infusion ( Intravenous New Bag/Given 02/25/18 0606)  sodium chloride 0.9 % bolus 500 mL (0 mLs Intravenous Stopped 02/25/18 0021)  acetaminophen (TYLENOL) tablet 650 mg (650 mg Oral Given 02/24/18 2342)  iopamidol (ISOVUE-300) 61 % injection (  Contrast Given 02/25/18 0030)  iopamidol (ISOVUE-300) 61 % injection 100  mL (100 mLs Intravenous Contrast Given 02/25/18 0236)    Mobility walks

## 2018-02-25 NOTE — H&P (Signed)
History and Physical    Hannah Sanders WUJ:[id number] DOB: 28-Feb-1976 DOA: 02/24/2018  PCP: Patient, No Pcp Per Patient coming from: Home  Chief Complaint: Fever and low hemoglobin  HPI: Hannah Sanders is a 42 y.o. female with medical history significant of hypertension, history of hep C, depression, breast enlargement, obstructive sleep apnea had Brazilian butt lift done on 02/15/2018 at [facility].  Patient was given a prescription for Keflex on the same day of the procedure as a prophylaxis.  Patient started having fever 02/18/2018.  She continued taking Keflex at home.  She reported that she had liposuction done so fat was taken out of her belly and was put on both of her buttocks.  Does complain of increased pain in the buttocks area.  She denies any nausea vomiting diarrhea, urinary complaints, cough.  She does complain of some shortness of breath which is new.  She denies any pleuritic chest pain.  She denies having had any drains placed or any dressings on her incisions in her butt or stomach.  However she reported that both the buttocks were hard which was expected and she was getting daily messages on that area.  And she reported that a lot of blood came out from the sites during the massage.  Her last menstrual period was January 25, 2018.  She denies heavy menstrual cycles.  She denies abdominal pain hematochezia melena.  She went to the urgent care first and from urgent care she was sent to [facility] long ER.  At the urgent care her hemoglobin was 8.2 which is down from 13.610 days ago prior to her procedure in [place].  She denies having a history of anemia.  She denies excessive menstrual bleeding.  She denies blood in her stool.  However she was found to be FOBT positive here.  She has no family history of any kind of anemia.  She denies any constipation.    ED Course: Patient was given Vanco and Zosyn.  A CT scan of the abdomen and pelvis was done in the emergency room  which showedNo acute intra-abdominal or pelvic pathology. 2. Inflammatory changes and stranding of the subcutaneous soft tissues of the abdomen and pelvis with areas of fat necrosis related to recent procedure. No drainable fluid collection or abscess.  Her lactic acid level was 1.67 hemoglobin was 8.2 hematocrit 24.8, pregnancy test was negative, fecal occult blood test was positive.  Influenza a and B was negative.  UA was negative.  Urine culture and blood culture was done.   Review of Systems: As per HPI otherwise all other systems reviewed and are negative Past Medical History:  Diagnosis Date  . Allergy   . Depression   . Hepatitis   . Hypertension   . Liver disease    Diagnosed With Hep C    Past Surgical History:  Procedure Laterality Date  . ABDOMINAL SURGERY  05/2016   ABDOMINOPLASTY   . BREAST SURGERY  05/2016   BREAST REDUCTION  . CHOLECYSTECTOMY, LAPAROSCOPIC      Social History   Socioeconomic History  . Marital status: Single    Spouse name: Not on file  . Number of children: Not on file  . Years of education: Not on file  . Highest education level: Not on file  Occupational History  . Not on file  Social Needs  . Financial resource strain: Not on file  . Food insecurity:    Worry: Not on file    Inability: Not  on file  . Transportation needs:    Medical: Not on file    Non-medical: Not on file  Tobacco Use  . Smoking status: Never Smoker  . Smokeless tobacco: Never Used  Substance and Sexual Activity  . Alcohol use: Yes    Comment: SOCIAL  . Drug use: No  . Sexual activity: Yes    Partners: Male  Lifestyle  . Physical activity:    Days per week: Not on file    Minutes per session: Not on file  . Stress: Not on file  Relationships  . Social connections:    Talks on phone: Not on file    Gets together: Not on file    Attends religious service: Not on file    Active member of club or organization: Not on file    Attends meetings of clubs  or organizations: Not on file    Relationship status: Not on file  . Intimate partner violence:    Fear of current or ex partner: Not on file    Emotionally abused: Not on file    Physically abused: Not on file    Forced sexual activity: Not on file  Other Topics Concern  . Not on file  Social History Narrative  . Not on file    Allergies  Allergen Reactions  . Shellfish-Derived Products Swelling    Family History  Problem Relation Age of Onset  . Heart disease Mother   . Hypertension Mother   . Diabetes Mother   . Heart disease Father   . Hypertension Father   . Diabetes Father   . Heart disease Brother   . Diabetes Daughter   . Heart disease Maternal Grandmother   . Hypertension Maternal Grandmother   . Diabetes Maternal Grandfather   . Diabetes Paternal Grandmother   . Diabetes Maternal Aunt   . Diabetes Maternal Uncle   . Diabetes Paternal Aunt   . Diabetes Paternal Grandfather       Prior to Admission medications   Medication Sig Start Date End Date Taking? Authorizing Provider  cephALEXin (KEFLEX) 500 MG capsule Take 500 mg by mouth 4 (four) times daily. 02/23/18  Yes [provider]  hydrochlorothiazide (HYDRODIURIL) 25 MG tablet Take 25 mg by mouth daily.   Yes [provider]  HYDROcodone-acetaminophen (NORCO) 10-325 MG tablet Take 1 tablet by mouth every 6 (six) hours as needed for moderate pain.  02/16/18  Yes [provider]    Physical Exam: Vitals:   02/25/18 [id number] 02/25/18 0611 02/25/18 0630 02/25/18 0718  BP: 110/65  108/66 109/81  Pulse: 87  87 69  Resp: 16  17 18   Temp: 99.1 F (37.3 C)   (!) 101.2 F (38.4 C)  TempSrc: Oral   Oral  SpO2: 99%  98% 98%  Weight:  99.3 kg (219 lb)  95.2 kg (209 lb 14.1 oz)  Height:  5\' 4"  (1.626 m)  5\' 4"  (1.626 m)     General: Appears calm and comfortable Eyes:  PERRL, EOMI, normal lids, iris ENT:  grossly normal hearing, lips & tongue, mmm Neck:  no LAD, masses or  thyromegaly Cardiovascular:  RRR, no m/r/g. No LE edema.  Respiratory:  CTA bilaterally, no w/r/r. Normal respiratory effort. Abdomen: soft, ntnd, NABS Skin: Warm tender and hard to touch  both the buttocks.  Some bruises noted on the left lower abdominal to pelvic area from liposuction, incision upper part of the umbilicus clean and dry.  musculoskeletal:  grossly normal  tone BUE/BLE, good ROM, no bony abnormality Psychiatric:  grossly normal mood and affect, speech fluent and appropriate, AOx3 Neurologic:  CN 2-12 grossly intact, moves all extremities in coordinated fashion, sensation intact  Labs on Admission: I have personally reviewed following labs and imaging studies  CBC: Recent Labs  Lab 02/24/18 2110 02/25/18 0522  WBC 11.2*  --   NEUTROABS 8.5*  --   HGB 8.6* 8.2*  HCT 26.2* 24.8*  MCV 94.2  --   PLT 373  --    Basic Metabolic Panel: Recent Labs  Lab 02/24/18 2110  NA 137  K 3.7  CL 101  CO2 26  GLUCOSE 96  BUN 8  CREATININE 0.91  CALCIUM 8.6*   GFR: Estimated Creatinine Clearance: 90.1 mL/min (by C-G formula based on SCr of 0.91 mg/dL). Liver Function Tests: Recent Labs  Lab 02/24/18 2110  AST 31  ALT 63*  ALKPHOS 138*  BILITOT 1.0  PROT 7.6  ALBUMIN 3.4*   No results for input(s): LIPASE, AMYLASE in the last 168 hours. No results for input(s): AMMONIA in the last 168 hours. Coagulation Profile: Recent Labs  Lab 02/24/18 2126  INR 1.11   Cardiac Enzymes: No results for input(s): CKTOTAL, CKMB, CKMBINDEX, TROPONINI in the last 168 hours. BNP (last 3 results) No results for input(s): PROBNP in the last 8760 hours. HbA1C: No results for input(s): HGBA1C in the last 72 hours. CBG: No results for input(s): GLUCAP in the last 168 hours. Lipid Profile: No results for input(s): CHOL, HDL, LDLCALC, TRIG, CHOLHDL, LDLDIRECT in the last 72 hours. Thyroid Function Tests: No results for input(s): TSH, T4TOTAL, FREET4, T3FREE, THYROIDAB in the last 72  hours. Anemia Panel: No results for input(s): VITAMINB12, FOLATE, FERRITIN, TIBC, IRON, RETICCTPCT in the last 72 hours. Urine analysis:    Component Value Date/Time   COLORURINE YELLOW 02/24/2018 2110   APPEARANCEUR CLEAR 02/24/2018 2110   LABSPEC 1.016 02/24/2018 2110   PHURINE 7.0 02/24/2018 2110   GLUCOSEU NEGATIVE 02/24/2018 2110   HGBUR MODERATE (A) 02/24/2018 2110   BILIRUBINUR NEGATIVE 02/24/2018 2110   KETONESUR NEGATIVE 02/24/2018 2110   PROTEINUR NEGATIVE 02/24/2018 2110   NITRITE NEGATIVE 02/24/2018 2110   LEUKOCYTESUR LARGE (A) 02/24/2018 2110    Creatinine Clearance: Estimated Creatinine Clearance: 90.1 mL/min (by C-G formula based on SCr of 0.91 mg/dL).  Sepsis Labs: @LABRCNTIP (procalcitonin:4,lacticidven:4) )No results found for this or any previous visit (from the past 240 hour(s)).   Radiological Exams on Admission: Dg Chest 2 View  Result Date: 02/24/2018 CLINICAL DATA:  Fever for 3 days, status post liposuction. EXAM: CHEST - 2 VIEW COMPARISON:  Chest radiograph February 27, 2016 FINDINGS: Cardiomediastinal silhouette is normal. No pleural effusions or focal consolidations. Trachea projects midline and there is no pneumothorax. Soft tissue planes and included osseous structures are non-suspicious. Surgical clips in the included right abdomen compatible with cholecystectomy. IMPRESSION: Negative. Electronically Signed   By: Awilda Metro M.D.   On: 02/24/2018 22:12   Ct Abdomen Pelvis W Contrast  Result Date: 02/25/2018 CLINICAL DATA:  42 year old female with abdominal pain. Body aches and fever. Status post [place] and liposuction. EXAM: CT ABDOMEN AND PELVIS WITH CONTRAST TECHNIQUE: Multidetector CT imaging of the abdomen and pelvis was performed using the standard protocol following bolus administration of intravenous contrast. CONTRAST:  ISOVUE-300 IOPAMIDOL (ISOVUE-300) INJECTION 61% COMPARISON:  None. FINDINGS: Lower chest: The visualized  lung bases are clear. No intra-abdominal free air or free fluid. Hepatobiliary: There is apparent mild  fatty infiltration of the liver. No intrahepatic biliary ductal dilatation. Cholecystectomy. Pancreas: Unremarkable. No pancreatic ductal dilatation or surrounding inflammatory changes. Spleen: Normal in size without focal abnormality. Adrenals/Urinary Tract: Adrenal glands are unremarkable. Kidneys are normal, without renal calculi, focal lesion, or hydronephrosis. Bladder is unremarkable. Stomach/Bowel: Stomach is within normal limits. Appendix appears normal. No evidence of bowel wall thickening, distention, or inflammatory changes. Vascular/Lymphatic: No significant vascular findings are present. No enlarged abdominal or pelvic lymph nodes. Reproductive: The uterus is anteverted. An intrauterine device is noted. The ovaries are grossly unremarkable as visualized. No pelvic mass. Other: There is diffuse stranding and edema of the subcutaneous soft tissues of the abdomen related to recent procedure. Scattered areas of subcutaneous fat necrosis as well as pockets of subcutaneous soft tissue air noted. There is no drainable fluid collection or abscess. Musculoskeletal: Degenerative changes primarily at L3-L4 with endplate irregularity and disc desiccation and vacuum phenomena. No acute osseous pathology. IMPRESSION: 1. No acute intra-abdominal or pelvic pathology. 2. Inflammatory changes and stranding of the subcutaneous soft tissues of the abdomen and pelvis with areas of fat necrosis related to recent procedure. No drainable fluid collection or abscess. Electronically Signed   By: Elgie Collard M.D.   On: 02/25/2018 03:00    EKG: Independently reviewed.  Assessment/Plan Active Problems:   Sepsis (HCC)  1] fever/cellulitis-status post [place] butt lift 3/18.  2019.  Both of her buttocks are warm, tender and hard to touch.  She got a dose of vancomycin and Zosyn in the ER.  She reports that she is  feeling better.  Her WBC count is 11.2. I will continue her IV antibiotics follow-up cultures and narrow the antibiotics.  Chest x-ray has been negative UA is negative.  2] new onset anemia?  Postop blood loss?  GI blood loss with FOBT positive?  Hemo-lysis.  Will obtain anemia panel hemolysis panel follow-up stat labs now.  GI has been consulted today.  3] history of hypertension patient takes hydrochlorothiazide 25 mg at home which will be on hold.  Her blood pressures are stable to low normal monitor closely.  4] shortness of breath-very mild.  Patient was able to speak to me in full sentences and move around in the bed without any difficulty in breathing.  I have not ordered a d-dimer as I feel her risk of PE is very low.  She is hemodynamically stable not tachycardic not tachypneic she is on room air saturation above 95%.  Chest x-ray was clear.   DVT prophylaxis: scd Code Status: Full code  family Communication: No family available Disposition Plan: TBD Consults called: GI Admission status: Inpatient   Alwyn Ren MD [facility]  If 7PM-7AM, please contact night-coverage [email or web address] Password [email or web address]  02/25/2018, 9:22 AM

## 2018-02-25 NOTE — Progress Notes (Signed)
Pharmacy Antibiotic Note  Hannah Sanders is a 42 y.o. female admitted on 02/24/2018 with sepsis.  Pharmacy has been consulted for Vancomycin and Zosyn dosing.  Plan: Zosyn 3.375g IV q8h (4 hour infusion).   Vancomycin 1gm iv x1, then 1250mg  iv q24hr  Goal AUC = 400 - 500 for all indications, except meningitis (goal AUC > 500 and Cmin 15-20 mcg/mL)   Height: 5\' 4"  (162.6 cm) Weight: 219 lb (99.3 kg) IBW/kg (Calculated) : 54.7  Temp (24hrs), Avg:99.6 F (37.6 C), Min:99.1 F (37.3 C), Max:100.8 F (38.2 C)  Recent Labs  Lab 02/24/18 2110 02/24/18 2222  WBC 11.2*  --   CREATININE 0.91  --   LATICACIDVEN  --  1.67    Estimated Creatinine Clearance: 92.2 mL/min (by C-G formula based on SCr of 0.91 mg/dL).    Allergies  Allergen Reactions  . Shellfish-Derived Products Swelling    Antimicrobials this admission: Vancomycin 02/25/2018 >> Zosyn 02/25/2018 >>   Dose adjustments this admission: -  Microbiology results: -  Thank you for allowing pharmacy to be a part of this patient's care.  Aleene DavidsonGrimsley Jr, Denasia Venn Crowford 02/25/2018 6:33 AM

## 2018-02-25 NOTE — Consult Note (Addendum)
Consultation  Referring Provider: Dr. Ashley RoyaltyMatthews     Primary Care Physician:  Patient, No Pcp Per Primary Gastroenterologist:[facility]         Reason for Consultation:   Anemia           HPI:   Hannah Sanders is a 42 y.o. female with a past medical history including hepatitis C, who presented to the ER 02/24/17 with a complaint of fever after recent [place] butt [facility] 02[facility].  In ER patient was found to be anemic with a hemoglobin down to 8.2 (13.6 10 days ago prior to procedure), we were consulted for this reason.    Today, explains [place] butt lift was done 02/15/18 and the procedure went fairly well but she did have 2 days of excessive bleeding afterwards from surgical incision sites.  Patient denies any labs being done after her procedure.  She was given some antibiotics there for 4 days and had a refill of this medication which she was taking over the past 2 days but continued with fever and chills as well as increasing "uncomfortableness" in her buttocks.      Denies fever, chills, melena, hematochezia, change in bowel habits, heartburn, reflux, nausea or vomiting, NSAID use, abdominal pain or rectal pain.  Gi History:  No history of previous EGD or Colonoscopy  Past Medical History:  Diagnosis Date  . Allergy   . Depression   . Hepatitis   . Hypertension   . Liver disease    Diagnosed With Hep C    Past Surgical History:  Procedure Laterality Date  . ABDOMINAL SURGERY  05/2016   ABDOMINOPLASTY   . BREAST SURGERY  05/2016   BREAST REDUCTION  . CHOLECYSTECTOMY, LAPAROSCOPIC      Family History  Problem Relation Age of Onset  . Heart disease Mother   . Hypertension Mother   . Diabetes Mother   . Heart disease Father   . Hypertension Father   . Diabetes Father   . Heart disease Brother   . Diabetes Daughter   . Heart disease Maternal Grandmother   . Hypertension Maternal Grandmother   . Diabetes  Maternal Grandfather   . Diabetes Paternal Grandmother   . Diabetes Maternal Aunt   . Diabetes Maternal Uncle   . Diabetes Paternal Aunt   . Diabetes Paternal Grandfather      Social History   Tobacco Use  . Smoking status: Never Smoker  . Smokeless tobacco: Never Used  Substance Use Topics  . Alcohol use: Yes    Comment: SOCIAL  . Drug use: No    Prior to Admission medications   Medication Sig Start Date End Date Taking? Authorizing Provider  cephALEXin (KEFLEX) 500 MG capsule Take 500 mg by mouth 4 (four) times daily. 02/23/18  Yes [provider]  hydrochlorothiazide (HYDRODIURIL) 25 MG tablet Take 25 mg by mouth daily.   Yes [provider]  HYDROcodone-acetaminophen (NORCO) 10-325 MG tablet Take 1 tablet by mouth every 6 (six) hours as needed for moderate pain.  02/16/18  Yes [provider]    Current Facility-Administered Medications  Medication Dose Route Frequency Provider Last Rate Last Dose  . 0.9 %  sodium chloride infusion   Intravenous Continuous Madelyn FlavorsSmith, Rondell A, MD 75 mL/hr at 02/25/18 0606    . acetaminophen (TYLENOL) tablet 650 mg  650 mg Oral Q6H PRN Alwyn RenMathews, Elizabeth G, MD   650 mg at 02/25/18 0831  .  piperacillin-tazobactam (ZOSYN) IVPB 3.375 g  3.375 g Intravenous Q8H Smith, Rondell A, MD      . vancomycin (VANCOCIN) 1,250 mg in sodium chloride 0.9 % 250 mL IVPB  1,250 mg Intravenous Q24H Madelyn Flavors A, MD        Allergies as of 02/24/2018 - Review Complete 02/24/2018  Allergen Reaction Noted  . Shellfish-derived products Swelling 05/26/2016     Review of Systems:    Constitutional: No weight loss Skin: No rash, + bruising over anterior pelvis and buttocks Cardiovascular: No chest pain Respiratory: No SOB  Gastrointestinal: See HPI and otherwise negative Genitourinary: No dysuria  Neurological: + headache Musculoskeletal: No new muscle or joint pain Hematologic: + recent bleeding from surgical incisions and  bruising Psychiatric: No history of depression or anxiety   Physical Exam:  Vital signs in last 24 hours: Temp:  [99.1 F (37.3 C)-101.2 F (38.4 C)] 101.2 F (38.4 C) (03/28 0718) Pulse Rate:  [69-101] 69 (03/28 0718) Resp:  [16-22] 18 (03/28 0718) BP: (108-128)/(61-81) 109/81 (03/28 0718) SpO2:  [97 %-100 %] 98 % (03/28 0718) Weight:  [209 lb 14.1 oz (95.2 kg)-219 lb (99.3 kg)] 209 lb 14.1 oz (95.2 kg) (03/28 0718) Last BM Date: 02/23/18 General:   Pleasant AA female appears to be in NAD, Well developed, Well nourished, alert and cooperative Head:  Normocephalic and atraumatic. Eyes:   PEERL, EOMI. No icterus. Conjunctiva pink. Ears:  Normal auditory acuity. Neck:  Supple Throat: Oral cavity and pharynx without inflammation, swelling or lesion.  Lungs: Respirations even and unlabored. Lungs clear to auscultation bilaterally.   No wheezes, crackles, or rhonchi.  Heart: Normal S1, S2. No MRG. Regular rate and rhythm. No peripheral edema, cyanosis or pallor.  Abdomen:  Soft, nondistended, nontender. No rebound or guarding. Normal bowel sounds. No appreciable masses or hepatomegaly. Rectal:  Not performed. Small healing incision sites on distal buttocks, visible bruising there as well as pelvic area Msk:  Symmetrical without gross deformities.  Extremities:  Without edema, no deformity or joint abnormality.  Neurologic:  Alert and  oriented x4;  grossly normal neurologically.  Skin:   Dry and intact without significant lesions or rashes. Psychiatric:  Demonstrates good judgement and reason without abnormal affect or behaviors.   LAB RESULTS: Recent Labs    02/24/18 2110 02/25/18 0522  WBC 11.2*  --   HGB 8.6* 8.2*  HCT 26.2* 24.8*  PLT 373  --    BMET Recent Labs    02/24/18 2110  NA 137  K 3.7  CL 101  CO2 26  GLUCOSE 96  BUN 8  CREATININE 0.91  CALCIUM 8.6*   LFT Recent Labs    02/24/18 2110  PROT 7.6  ALBUMIN 3.4*  AST 31  ALT 63*  ALKPHOS 138*   BILITOT 1.0   PT/INR Recent Labs    02/24/18 2126  LABPROT 14.2  INR 1.11    STUDIES: Dg Chest 2 View  Result Date: 02/24/2018 CLINICAL DATA:  Fever for 3 days, status post liposuction. EXAM: CHEST - 2 VIEW COMPARISON:  Chest radiograph February 27, 2016 FINDINGS: Cardiomediastinal silhouette is normal. No pleural effusions or focal consolidations. Trachea projects midline and there is no pneumothorax. Soft tissue planes and included osseous structures are non-suspicious. Surgical clips in the included right abdomen compatible with cholecystectomy. IMPRESSION: Negative. Electronically Signed   By: Awilda Metro M.D.   On: 02/24/2018 22:12   Ct Abdomen Pelvis W Contrast  Result Date: 02/25/2018 CLINICAL DATA:  42 year old  female with abdominal pain. Body aches and fever. Status post [place] and liposuction. EXAM: CT ABDOMEN AND PELVIS WITH CONTRAST TECHNIQUE: Multidetector CT imaging of the abdomen and pelvis was performed using the standard protocol following bolus administration of intravenous contrast. CONTRAST:  ISOVUE-300 IOPAMIDOL (ISOVUE-300) INJECTION 61% COMPARISON:  None. FINDINGS: Lower chest: The visualized lung bases are clear. No intra-abdominal free air or free fluid. Hepatobiliary: There is apparent mild fatty infiltration of the liver. No intrahepatic biliary ductal dilatation. Cholecystectomy. Pancreas: Unremarkable. No pancreatic ductal dilatation or surrounding inflammatory changes. Spleen: Normal in size without focal abnormality. Adrenals/Urinary Tract: Adrenal glands are unremarkable. Kidneys are normal, without renal calculi, focal lesion, or hydronephrosis. Bladder is unremarkable. Stomach/Bowel: Stomach is within normal limits. Appendix appears normal. No evidence of bowel wall thickening, distention, or inflammatory changes. Vascular/Lymphatic: No significant vascular findings are present. No enlarged abdominal or pelvic lymph nodes. Reproductive: The  uterus is anteverted. An intrauterine device is noted. The ovaries are grossly unremarkable as visualized. No pelvic mass. Other: There is diffuse stranding and edema of the subcutaneous soft tissues of the abdomen related to recent procedure. Scattered areas of subcutaneous fat necrosis as well as pockets of subcutaneous soft tissue air noted. There is no drainable fluid collection or abscess. Musculoskeletal: Degenerative changes primarily at L3-L4 with endplate irregularity and disc desiccation and vacuum phenomena. No acute osseous pathology. IMPRESSION: 1. No acute intra-abdominal or pelvic pathology. 2. Inflammatory changes and stranding of the subcutaneous soft tissues of the abdomen and pelvis with areas of fat necrosis related to recent procedure. No drainable fluid collection or abscess. Electronically Signed   By: Elgie Collard M.D.   On: 02/25/2018 03:00    Impression / Plan:   Impression: 1.  Anemia: hgb 13.6 pre-procedure-->8.2 today, no h/o of acute GI bleeding, no GI symptoms per patient, does describe 2 days of heavy bleeding after procedure as well as heavy blood loss during procedure; most likely anemia is due to procedure related bleeding 2.  FOBT positive stool: would recommend follow up with her regular GI physician at [facility] after discharge 3.  History of recent [place] butt lift: 02/15/18  Plan: 1.  Patient seen and examined with Dr. Christella Hartigan this morning. No plans for endoscopic procedures during this admission, it is most likely the patient's anemia is from her recent procedure. 2.  Discussed with the patient that we would recommend she follow with her regular GI physician at [facility] 4-6 weeks after discharge to discuss Hemoccult positive stool. 3.  Continue other supportive measures 4.  Please await any further recommendations from Dr. Christella Hartigan later today.  Thank you for your kind consultation, we will sign off.  Violet Baldy Lemmon  02/25/2018, 10:02 AM Pager  #: [phone]  ________________________________________________________________________  Corinda Gubler GI MD note:  I personally examined the patient, reviewed the data and agree with the assessment and plan described above.  She is hemocult positive but has not seen any overt GI bleeding. Hb drop several grams is almost certainly related to blood loss during and after her [place] in [place].  Interesting fact; the BBL has highest mortality rate of all cosmetic procedures (1 in 3,000 deaths for BBL vs. 1 in 25,000 deaths for all cosmetic procedures).  No plans for endoscopic testing.  Should transfuse blood products as needed. She knows to follow up with her [facility] gastroenterologist following discharge from here to discuss hemocult positive stool.    Please call or page if  she has any overt, significant GI bleeding.  Rob Bunting, MD [facility] Gastroenterology Pager [phone]

## 2018-02-25 NOTE — Plan of Care (Addendum)
Discussed case with Avie Echevaria, PA-C  Hannah Sanders is a 42 year old female with pmh HTN, depression, and hepatitis C; who presents with complaints of fever and generalized malaise x1 week after being postop from [place] butt lift performed in [place], [place] on 3/18.  Patient had reportedly been on Keflex since 3/19.    VS: Temperature elevated to 100.8 F, heart rates 83-101, respirations 16-22, blood pressure 109/79 - 128/75, and O2 saturations maintained on room air.  Labs revealed  Hemoglobin dropped from 13.6 preop down to 8.6 on presentation. Patient was found to be guaiac positive.  She was typed and screened for possible need of blood products, blood cultures obtained, and given 1.5 L of normal saline IV fluids.  Patient appears to have met sepsis criteria with reassuring lactic acid.  CT scan of abdomen pelvis showed no postsurgical changes with fat necrosis without signs of drainable abscess.  Antibiotics were initially ordered, and repeat H&H is pending.  Added on antibiotics of vancomycin and Zosyn for unknown source.

## 2018-02-25 NOTE — Progress Notes (Signed)
Pt c/o pain. Tylenol is not due until 2045. Craige CottaKirby, NP paged, pt has Norco on home med list but it's not currently ordered. Awaiting further orders at this time.

## 2018-02-26 DIAGNOSIS — A419 Sepsis, unspecified organism: Secondary | ICD-10-CM

## 2018-02-26 LAB — URINE CULTURE: Culture: <10000 — AB

## 2018-02-26 LAB — CBC
HEMATOCRIT: 22.5 % — AB (ref 36.0–46.0)
HEMOGLOBIN: 7.4 g/dL — AB (ref 12.0–15.0)
MCH: 31 pg (ref 26.0–34.0)
MCHC: 32.9 g/dL (ref 30.0–36.0)
MCV: 94.1 fL (ref 78.0–100.0)
Platelets: 369 10*3/uL (ref 150–400)
RBC: 2.39 MIL/uL — AB (ref 3.87–5.11)
RDW: 14.5 % (ref 11.5–15.5)
WBC: 10.8 10*3/uL — ABNORMAL HIGH (ref 4.0–10.5)

## 2018-02-26 LAB — CREATININE, SERUM
Creatinine, Ser: 0.92 mg/dL (ref 0.44–1.00)
GFR calc Af Amer: >60 mL/min (ref >60)
GFR calc non Af Amer: >60 mL/min (ref >60)

## 2018-02-26 LAB — PREPARE RBC (CROSSMATCH)

## 2018-02-26 MED ORDER — SODIUM CHLORIDE 0.9 % IV BOLUS
1000.0000 mL | Freq: Once | INTRAVENOUS | Status: AC
  Administered 2018-02-26: 1000 mL via INTRAVENOUS

## 2018-02-26 MED ORDER — IBUPROFEN 200 MG PO TABS
400.0000 mg | ORAL_TABLET | Freq: Once | ORAL | Status: AC
  Administered 2018-02-26: 400 mg via ORAL
  Filled 2018-02-26: qty 2

## 2018-02-26 MED ORDER — DOCUSATE SODIUM 100 MG PO CAPS
100.0000 mg | ORAL_CAPSULE | Freq: Two times a day (BID) | ORAL | Status: DC
  Administered 2018-02-26 – 2018-03-04 (×13): 100 mg via ORAL
  Filled 2018-02-26 (×13): qty 1

## 2018-02-26 MED ORDER — SODIUM CHLORIDE 0.9 % IV SOLN
Freq: Once | INTRAVENOUS | Status: AC
  Administered 2018-02-28: 21:00:00 via INTRAVENOUS

## 2018-02-26 MED ORDER — FERROUS SULFATE 325 (65 FE) MG PO TABS
325.0000 mg | ORAL_TABLET | Freq: Two times a day (BID) | ORAL | Status: DC
  Administered 2018-02-26 – 2018-03-04 (×12): 325 mg via ORAL
  Filled 2018-02-26 (×12): qty 1

## 2018-02-26 MED ORDER — HYDROCODONE-ACETAMINOPHEN 10-325 MG PO TABS
1.0000 | ORAL_TABLET | Freq: Four times a day (QID) | ORAL | Status: DC | PRN
  Administered 2018-02-26 – 2018-03-04 (×15): 1 via ORAL
  Filled 2018-02-26 (×16): qty 1

## 2018-02-26 NOTE — Progress Notes (Signed)
Patient transferred from 1 west. RN agrees with previous assessment. Vital signs done. MD paged regarding a fever of 100.98F. Patient is due to receive 2 units of PRBCs. Per MD, administer Tylenol and wait until fever is stabilized before starting blood. Patient aware. Rn will continue to monitor the patient.

## 2018-02-26 NOTE — Progress Notes (Signed)
Patient continues to have a fever of 100.36F after the Tylenol. MD notified and received order to administer Ibuprofen 400 mg and recheck temperature. Will continue to monitor patient.

## 2018-02-26 NOTE — Progress Notes (Signed)
PROGRESS NOTE    Hannah Sanders [facility]  ZOX:[id number] DOB: 1976/06/22 DOA: 02/24/2018 PCP: Patient, No Pcp Per   Brief Narrative:   Hannah Sanders is a 42 y.o. female with medical history significant of hypertension, history of hep C, depression, breast enlargement, obstructive sleep apnea had Brazilian butt lift done on 02/15/2018 at [facility].  Patient was given a prescription for Keflex on the same day of the procedure as a prophylaxis.  He presents with worsening pain and fever and symptomatic anemia.    Assessment & Plan:   Active Problems:   Sepsis (HCC)   On admission patient was febrile slightly tachycardic hypotensive, leukocytosis but with normal lactic acid and findings of cellulitis. Sepsis probably secondary to cellulitis of the buttocks s/p butt lift.  Blood cultures pending.  On IV vancomycin and zosyn.  CT abdomen and pelvis does not show any areas of abscess or accumulation of fluid.    Symptomatic anemia/anemia of blood loss post butt lift. As per the monitor at bedside her baseline hemoglobin is around 13 and she reports post surgery she lost a lot of blood.  Her hemoglobin at the urgent care was around 8.2.  On admission patient's hemoglobin was 8.2 today at 7.4.  Though there are no signs of active bleeding at this time suspect the drop in hemoglobin since admission is probably from hemodilution. Transfuse 2 units PRBC today keep hemoglobin greater than 7.  Reviewed anemia panel and patient's iron levels appear to be low.  Provide iron supplementation. Patient's stool for guiac is positive, but no signs of overt bleeding.    Hypertension blood pressures are well controlled.  will add as needed hydralazine if needed      DVT prophylaxis: SCDs Code Status: Full code Family Communication: Mom at bedside Disposition Plan: Pending resolution of fever  Consultants:   None   Procedures: None   Antimicrobials: (Vancomycin and Zosyn since  admission   Subjective: Patient reports pain is controlled.  Objective: Vitals:   02/25/18 2143 02/26/18 0010 02/26/18 0122 02/26/18 0623  BP: (!) 102/53   (!) 105/58  Pulse: 90   86  Resp: 18   18  Temp: 99.7 F (37.6 C) (!) 101 F (38.3 C) 99.7 F (37.6 C) (!) 100.6 F (38.1 C)  TempSrc: Oral Oral Oral Oral  SpO2: 99%   90%  Weight:      Height:        Intake/Output Summary (Last 24 hours) at 02/26/2018 1532 Last data filed at 02/26/2018 0814 Gross per 24 hour  Intake 2360 ml  Output -  Net 2360 ml   Filed Weights   02/25/18 0611 02/25/18 0718  Weight: 99.3 kg (219 lb) 95.2 kg (209 lb 14.1 oz)    Examination:  General exam: Appears calm and comfortable  Respiratory system: Clear to auscultation. Respiratory effort normal. Cardiovascular system: S1 & S2 heard, RRR. No JVD, murmurs, rubs, gallops or clicks. No pedal edema. Gastrointestinal system: Abdomen is nondistended, soft and nontender. No organomegaly or masses felt. Normal bowel sounds heard. Central nervous system: Alert and oriented. No focal neurological deficits. Extremities:  Erythema, swelling and tenderness over the .buttocks. Incision sites are good, no drainage Skin: No rashes, lesions or ulcers Psychiatry: Judgement and insight appear normal. Mood & affect appropriate.     Data Reviewed: I have personally reviewed following labs and imaging studies  CBC: Recent Labs  Lab 02/24/18 2110 02/25/18 0522 02/25/18 1053 02/26/18 0411  WBC 11.2*  --  9.9 10.8*  NEUTROABS 8.5*  --   --   --   HGB 8.6* 8.2* 8.2* 7.4*  HCT 26.2* 24.8* 24.8* 22.5*  MCV 94.2  --  93.6 94.1  PLT 373  --  347  353 369   Basic Metabolic Panel: Recent Labs  Lab 02/24/18 2110 02/25/18 1053 02/26/18 0411  NA 137 140  --   K 3.7 3.5  --   CL 101 103  --   CO2 26 25  --   GLUCOSE 96 111*  --   BUN 8 8  --   CREATININE 0.91 0.97 0.92  CALCIUM 8.6* 8.5*  --    GFR: Estimated Creatinine Clearance: 89.2 mL/min (by  C-G formula based on SCr of 0.92 mg/dL). Liver Function Tests: Recent Labs  Lab 02/24/18 2110 02/25/18 1053  AST 31 30  ALT 63* 53  ALKPHOS 138* 130*  BILITOT 1.0 1.2  PROT 7.6 6.9  ALBUMIN 3.4* 2.8*   No results for input(s): LIPASE, AMYLASE in the last 168 hours. No results for input(s): AMMONIA in the last 168 hours. Coagulation Profile: Recent Labs  Lab 02/24/18 2126 02/25/18 1053  INR 1.11 1.18  1.18   Cardiac Enzymes: No results for input(s): CKTOTAL, CKMB, CKMBINDEX, TROPONINI in the last 168 hours. BNP (last 3 results) No results for input(s): PROBNP in the last 8760 hours. HbA1C: No results for input(s): HGBA1C in the last 72 hours. CBG: No results for input(s): GLUCAP in the last 168 hours. Lipid Profile: No results for input(s): CHOL, HDL, LDLCALC, TRIG, CHOLHDL, LDLDIRECT in the last 72 hours. Thyroid Function Tests: No results for input(s): TSH, T4TOTAL, FREET4, T3FREE, THYROIDAB in the last 72 hours. Anemia Panel: Recent Labs    02/25/18 1053  FERRITIN 202  TIBC 188*  IRON 17*   Sepsis Labs: Recent Labs  Lab 02/24/18 2222  LATICACIDVEN 1.67    Recent Results (from the past 240 hour(s))  Urine culture     Status: Abnormal   Collection Time: 02/24/18  9:15 PM  Result Value Ref Range Status   Specimen Description   Final    URINE, CATHETERIZED Performed at [facility], [address] [place], [place] [address]    Special Requests   Final    NONE Performed at [facility], [address], [place], [place] [address]    Culture (A)  Final    <10,000 COLONIES/mL INSIGNIFICANT GROWTH Performed at [facility], [address] [place], [place] [address]    Report Status 02/26/2018 FINAL  Final  Culture, blood (routine x 2)     Status: None (Preliminary result)   Collection Time: 02/24/18  9:30 PM  Result Value Ref Range Status   Specimen Description   Final    BLOOD LEFT FOREARM Performed at  [facility], [address], [place], [place] [address]    Special Requests   Final    BOTTLES DRAWN AEROBIC AND ANAEROBIC Blood Culture adequate volume Performed at [facility], [address], [place], [place] [address]    Culture   Final    NO GROWTH 2 DAYS Performed at [facility], [address], [place], [place] [address]    Report Status PENDING  Incomplete  Culture, blood (routine x 2)     Status: None (Preliminary result)   Collection Time: 02/24/18  9:30 PM  Result Value Ref Range Status   Specimen Description   Final  BLOOD LEFT ARM Performed at [facility], [address] [place], [place] [address]    Special Requests   Final    BOTTLES DRAWN AEROBIC AND ANAEROBIC Blood Culture adequate volume Performed at [facility], [address] [place], [place] [address]    Culture   Final    NO GROWTH 2 DAYS Performed at [facility], [address] [place], [place] [address]    Report Status PENDING  Incomplete         Radiology Studies: Dg Chest 2 View  Result Date: 02/24/2018 CLINICAL DATA:  Fever for 3 days, status post liposuction. EXAM: CHEST - 2 VIEW COMPARISON:  Chest radiograph February 27, 2016 FINDINGS: Cardiomediastinal silhouette is normal. No pleural effusions or focal consolidations. Trachea projects midline and there is no pneumothorax. Soft tissue planes and included osseous structures are non-suspicious. Surgical clips in the included right abdomen compatible with cholecystectomy. IMPRESSION: Negative. Electronically Signed   By: Awilda Metroourtnay  Bloomer M.D.   On: 02/24/2018 22:12   Ct Abdomen Pelvis W Contrast  Result Date: 02/25/2018 CLINICAL DATA:  42 year old female with abdominal pain. Body aches and fever. Status post [place] Butt Lift and liposuction. EXAM: CT ABDOMEN AND PELVIS WITH CONTRAST TECHNIQUE: Multidetector CT imaging of the abdomen and pelvis was  performed using the standard protocol following bolus administration of intravenous contrast. CONTRAST:  100mL ISOVUE-300 IOPAMIDOL (ISOVUE-300) INJECTION 61% COMPARISON:  None. FINDINGS: Lower chest: The visualized lung bases are clear. No intra-abdominal free air or free fluid. Hepatobiliary: There is apparent mild fatty infiltration of the liver. No intrahepatic biliary ductal dilatation. Cholecystectomy. Pancreas: Unremarkable. No pancreatic ductal dilatation or surrounding inflammatory changes. Spleen: Normal in size without focal abnormality. Adrenals/Urinary Tract: Adrenal glands are unremarkable. Kidneys are normal, without renal calculi, focal lesion, or hydronephrosis. Bladder is unremarkable. Stomach/Bowel: Stomach is within normal limits. Appendix appears normal. No evidence of bowel wall thickening, distention, or inflammatory changes. Vascular/Lymphatic: No significant vascular findings are present. No enlarged abdominal or pelvic lymph nodes. Reproductive: The uterus is anteverted. An intrauterine device is noted. The ovaries are grossly unremarkable as visualized. No pelvic mass. Other: There is diffuse stranding and edema of the subcutaneous soft tissues of the abdomen related to recent procedure. Scattered areas of subcutaneous fat necrosis as well as pockets of subcutaneous soft tissue air noted. There is no drainable fluid collection or abscess. Musculoskeletal: Degenerative changes primarily at L3-L4 with endplate irregularity and disc desiccation and vacuum phenomena. No acute osseous pathology. IMPRESSION: 1. No acute intra-abdominal or pelvic pathology. 2. Inflammatory changes and stranding of the subcutaneous soft tissues of the abdomen and pelvis with areas of fat necrosis related to recent procedure. No drainable fluid collection or abscess. Electronically Signed   By: Elgie CollardArash  Radparvar M.D.   On: 02/25/2018 03:00        Scheduled Meds: . docusate sodium  100 mg Oral BID  . ferrous  sulfate  325 mg Oral BID WC   Continuous Infusions: . sodium chloride 75 mL/hr at 02/26/18 0952  . sodium chloride    . piperacillin-tazobactam (ZOSYN)  IV 3.375 g (02/26/18 1420)  . vancomycin Stopped (02/25/18 1900)     LOS: 1 day    Time spent: 35 minutes.     Kathlen ModyVijaya Acelynn Dejonge, MD [facility] Pager [phone]  If 7PM-7AM, please contact night-coverage [email or web address] Password [email or web address] 02/26/2018, 3:32 PM

## 2018-02-26 NOTE — Progress Notes (Signed)
Patient continues to have a fever of 100 F. Md notified and given orders to complete the zosyn and administer Vancomycin. Once antibiotics have completed; check vital signs, if patient continues to remain febrile at 100F or less; administer the blood and closely monitor the patient. Also given order to administer bolus due to low blood pressure. RN advised the patient and will continue to monitor the patient.

## 2018-02-26 NOTE — Progress Notes (Signed)
   02/26/18 1300  Clinical Encounter Type  Visited With Patient and family together  Visit Type Initial;Psychological support;Spiritual support  Referral From Nurse  Consult/Referral To Chaplain  Spiritual Encounters  Spiritual Needs Other (Comment) (Advance directive )  Stress Factors  Patient Stress Factors Other (Comment);Health changes (Advacen Directive )  Family Stress Factors Other (Comment) (Advance Directive )  Advance Directives (For Healthcare)  Does Patient Have a Medical Advance Directive? No  Would patient like information on creating a medical advance directive? Yes (Inpatient - patient requests chaplain consult to create a medical advance directive) (Patient Given Information )   I visited with the patient per Spiritual Care Consult for an Advance Directive.  I provided the paperwork to the patient. The patient wants to name her mother as her Healthcare agent, in the event that she is unable to make her healthcare decisions.  The patient had a headache at the time of my visit; and would like to complete at a later time.   Please, contact Spiritual Care for further assistance.   Chaplain Clint BolderBrittany Liliah Dorian M.Div., [facility]

## 2018-02-27 ENCOUNTER — Inpatient Hospital Stay (HOSPITAL_COMMUNITY): Payer: 59

## 2018-02-27 LAB — CBC
HEMATOCRIT: 26.6 % — AB (ref 36.0–46.0)
HEMOGLOBIN: 8.9 g/dL — AB (ref 12.0–15.0)
MCH: 30.8 pg (ref 26.0–34.0)
MCHC: 33.5 g/dL (ref 30.0–36.0)
MCV: 92 fL (ref 78.0–100.0)
Platelets: 358 10*3/uL (ref 150–400)
RBC: 2.89 MIL/uL — ABNORMAL LOW (ref 3.87–5.11)
RDW: 15.4 % (ref 11.5–15.5)
WBC: 13.4 10*3/uL — AB (ref 4.0–10.5)

## 2018-02-27 LAB — BASIC METABOLIC PANEL
ANION GAP: 8 (ref 5–15)
BUN: 7 mg/dL (ref 6–20)
CALCIUM: 8.2 mg/dL — AB (ref 8.9–10.3)
CHLORIDE: 107 mmol/L (ref 101–111)
CO2: 24 mmol/L (ref 22–32)
Creatinine, Ser: 0.69 mg/dL (ref 0.44–1.00)
GFR calc non Af Amer: >60 mL/min (ref >60)
GLUCOSE: 102 mg/dL — AB (ref 65–99)
POTASSIUM: 3.9 mmol/L (ref 3.5–5.1)
Sodium: 139 mmol/L (ref 135–145)

## 2018-02-27 MED ORDER — VANCOMYCIN HCL IN DEXTROSE 1-5 GM/200ML-% IV SOLN
1000.0000 mg | Freq: Two times a day (BID) | INTRAVENOUS | Status: DC
  Administered 2018-02-27 – 2018-03-02 (×8): 1000 mg via INTRAVENOUS
  Filled 2018-02-27 (×8): qty 200

## 2018-02-27 MED ORDER — IBUPROFEN 200 MG PO TABS
400.0000 mg | ORAL_TABLET | Freq: Four times a day (QID) | ORAL | Status: DC | PRN
  Administered 2018-02-27: 400 mg via ORAL
  Filled 2018-02-27: qty 2

## 2018-02-27 MED ORDER — IBUPROFEN 200 MG PO TABS
400.0000 mg | ORAL_TABLET | Freq: Four times a day (QID) | ORAL | Status: DC | PRN
  Administered 2018-02-28 – 2018-03-03 (×5): 400 mg via ORAL
  Filled 2018-02-27 (×6): qty 2

## 2018-02-27 MED ORDER — IBUPROFEN 200 MG PO TABS
400.0000 mg | ORAL_TABLET | Freq: Once | ORAL | Status: AC
  Administered 2018-02-27: 400 mg via ORAL
  Filled 2018-02-27: qty 2

## 2018-02-27 MED ORDER — ACETAMINOPHEN 325 MG PO TABS
650.0000 mg | ORAL_TABLET | Freq: Four times a day (QID) | ORAL | Status: DC | PRN
Start: 2018-02-27 — End: 2018-03-04
  Administered 2018-02-27 – 2018-03-04 (×10): 650 mg via ORAL
  Filled 2018-02-27 (×11): qty 2

## 2018-02-27 NOTE — Progress Notes (Signed)
PROGRESS NOTE    Hannah Sanders [facility]  ZOX:[id number] DOB: 1976-05-11 DOA: 02/24/2018 PCP: Patient, No Pcp Per   Brief Narrative:   Hannah Sanders is a 42 y.o. female with medical history significant of hypertension, history of hep C, depression, breast enlargement, obstructive sleep apnea had Brazilian butt lift done on 02/15/2018 at [facility].  Patient was given a prescription for Keflex on the same day of the procedure as a prophylaxis.  He presents with worsening pain and fever and symptomatic anemia.    Assessment & Plan:   Active Problems:   Sepsis (HCC)   On admission patient was febrile slightly tachycardic hypotensive, leukocytosis but with normal lactic acid and findings of cellulitis. Sepsis probably secondary to cellulitis of the buttocks s/p butt lift.  Blood cultures pending and negative so far.  On IV vancomycin and zosyn.  CT abdomen and pelvis does not show any areas of abscess or accumulation of fluid. Fevers have come down, but leukocytosis worsening.        Symptomatic anemia/anemia of blood loss post butt lift. As per the monitor at bedside her baseline hemoglobin is around 13 and she reports post surgery she lost a lot of blood.  Her hemoglobin at the urgent care was around 8.2.  On admission patient's hemoglobin was 8.2 today at 7.4.  Though there are no signs of active bleeding at this time suspect the drop in hemoglobin since admission is probably from hemodilution. Transfuse 2 units PRBC today keep hemoglobin greater than 7.  Reviewed anemia panel and patient's iron levels appear to be low.  Provide iron supplementation. Patient's stool for guiac is positive, but no signs of overt bleeding. Recommend outpatient work for guiac positive stools.  Hemoglobins table around 8.9.      Headaches for 3 days; Severe, uncontrollable.  Prn ibuporfen.    Hypertension blood pressures are well controlled.  will add as needed hydralazine if  needed      DVT prophylaxis: SCDs Code Status: Full code Family Communication: none.  at bedside Disposition Plan: Pending resolution of fever  Consultants:   None   Procedures: None   Antimicrobials: (Vancomycin and Zosyn since admission   Subjective: Patient reports pain is the same.  Headache worsetoday.   Objective: Vitals:   02/27/18 0325 02/27/18 0503 02/27/18 1500 02/27/18 1722  BP: (!) 107/57 108/69 117/68   Pulse: 88 86 78   Resp: 16 16 20    Temp: (!) 100.4 F (38 C) 99.7 F (37.6 C) 99.1 F (37.3 C) 98.9 F (37.2 C)  TempSrc: Oral Oral Oral Oral  SpO2: 100% 98% 94%   Weight:      Height:        Intake/Output Summary (Last 24 hours) at 02/27/2018 1954 Last data filed at 02/27/2018 1724 Gross per 24 hour  Intake 2375 ml  Output -  Net 2375 ml   Filed Weights   02/25/18 0611 02/25/18 0718  Weight: 99.3 kg (219 lb) 95.2 kg (209 lb 14.1 oz)    Examination:  General exam: Appears calm and comfortable  Respiratory system: clear, no wheezing or rhonchi.  Cardiovascular system: S1 & S2 heard, RRR. No JVD, murmurs, . No pedal edema. Gastrointestinal system: Abdomenis soft NT ND bs+ Central nervous system: Alert and oriented. No focal neurological deficits. Extremities:  Erythema, swelling and tenderness over the .buttocks , still the same, not much improvement.  Incision sites are good, no drainage Skin: No rashes, lesions or ulcers Psychiatry: Judgement and insight  appear normal. Mood & affect appropriate.     Data Reviewed: I have personally reviewed following labs and imaging studies  CBC: Recent Labs  Lab 02/24/18 2110 02/25/18 0522 02/25/18 1053 02/26/18 0411 02/27/18 0546  WBC 11.2*  --  9.9 10.8* 13.4*  NEUTROABS 8.5*  --   --   --   --   HGB 8.6* 8.2* 8.2* 7.4* 8.9*  HCT 26.2* 24.8* 24.8* 22.5* 26.6*  MCV 94.2  --  93.6 94.1 92.0  PLT 373  --  347  353 369 358   Basic Metabolic Panel: Recent Labs  Lab 02/24/18 2110  02/25/18 1053 02/26/18 0411 02/27/18 0546  NA 137 140  --  139  K 3.7 3.5  --  3.9  CL 101 103  --  107  CO2 26 25  --  24  GLUCOSE 96 111*  --  102*  BUN 8 8  --  7  CREATININE 0.91 0.97 0.92 0.69  CALCIUM 8.6* 8.5*  --  8.2*   GFR: Estimated Creatinine Clearance: 102.5 mL/min (by C-G formula based on SCr of 0.69 mg/dL). Liver Function Tests: Recent Labs  Lab 02/24/18 2110 02/25/18 1053  AST 31 30  ALT 63* 53  ALKPHOS 138* 130*  BILITOT 1.0 1.2  PROT 7.6 6.9  ALBUMIN 3.4* 2.8*   No results for input(s): LIPASE, AMYLASE in the last 168 hours. No results for input(s): AMMONIA in the last 168 hours. Coagulation Profile: Recent Labs  Lab 02/24/18 2126 02/25/18 1053  INR 1.11 1.18  1.18   Cardiac Enzymes: No results for input(s): CKTOTAL, CKMB, CKMBINDEX, TROPONINI in the last 168 hours. BNP (last 3 results) No results for input(s): PROBNP in the last 8760 hours. HbA1C: No results for input(s): HGBA1C in the last 72 hours. CBG: No results for input(s): GLUCAP in the last 168 hours. Lipid Profile: No results for input(s): CHOL, HDL, LDLCALC, TRIG, CHOLHDL, LDLDIRECT in the last 72 hours. Thyroid Function Tests: No results for input(s): TSH, T4TOTAL, FREET4, T3FREE, THYROIDAB in the last 72 hours. Anemia Panel: Recent Labs    02/25/18 1053  FERRITIN 202  TIBC 188*  IRON 17*   Sepsis Labs: Recent Labs  Lab 02/24/18 2222  LATICACIDVEN 1.67    Recent Results (from the past 240 hour(s))  Urine culture     Status: Abnormal   Collection Time: 02/24/18  9:15 PM  Result Value Ref Range Status   Specimen Description   Final    URINE, CATHETERIZED Performed at [facility], [address] [place], [place] [address]    Special Requests   Final    NONE Performed at [facility], [address] [place], [place] [address]    Culture (A)  Final    <10,000 COLONIES/mL INSIGNIFICANT GROWTH Performed at [facility], [address] [place], [place] [address]    Report Status 02/26/2018 FINAL  Final  Culture, blood (routine x 2)     Status: None (Preliminary result)   Collection Time: 02/24/18  9:30 PM  Result Value Ref Range Status   Specimen Description   Final    BLOOD LEFT FOREARM Performed at [facility], [address] [place], [place] [address]    Special Requests   Final    BOTTLES DRAWN AEROBIC AND ANAEROBIC Blood Culture adequate volume Performed at [facility], [address] [place], [place] [address]    Culture   Final  NO GROWTH 3 DAYS Performed at [facility], [address] [place], [place] [address]    Report Status PENDING  Incomplete  Culture, blood (routine x 2)     Status: None (Preliminary result)   Collection Time: 02/24/18  9:30 PM  Result Value Ref Range Status   Specimen Description   Final    BLOOD LEFT ARM Performed at [facility], [address], [place], [place] [address]    Special Requests   Final    BOTTLES DRAWN AEROBIC AND ANAEROBIC Blood Culture adequate volume Performed at [facility], [address], [place], [place] [address]    Culture   Final    NO GROWTH 3 DAYS Performed at [facility], [address], [place], [place] [address]    Report Status PENDING  Incomplete         Radiology Studies: Ct Head Wo Contrast  Result Date: 02/27/2018 CLINICAL DATA:  Headache, acute, severe, worst of life. Persistent headaches since surgery EXAM: CT HEAD WITHOUT CONTRAST TECHNIQUE: Contiguous axial images were obtained from the base of the skull through the vertex without intravenous contrast. COMPARISON:  None. FINDINGS: Brain: No evidence of acute infarction, hemorrhage, hydrocephalus, extra-axial collection or mass lesion/mass effect. Vascular: No hyperdense vessel or unexpected calcification. Skull: Normal. Negative for fracture or focal lesion.  Sinuses/Orbits: Negative IMPRESSION: Negative head CT.  No explanation for headache. Electronically Signed   By: Marnee Spring M.D.   On: 02/27/2018 11:56        Scheduled Meds: . docusate sodium  100 mg Oral BID  . ferrous sulfate  325 mg Oral BID WC   Continuous Infusions: . sodium chloride 75 mL/hr at 02/26/18 0952  . sodium chloride    . piperacillin-tazobactam (ZOSYN)  IV Stopped (02/27/18 1749)  . vancomycin Stopped (02/27/18 1443)     LOS: 2 days    Time spent: 35 minutes.     Kathlen Mody, MD [facility] Pager [phone]  If 7PM-7AM, please contact night-coverage [email or web address] Password [facility] 02/27/2018, 7:54 PM

## 2018-02-27 NOTE — Progress Notes (Signed)
Pharmacy Antibiotic Note  Hannah Sanders is a 42 y.o. female admitted on 02/24/2018 with sepsis.  Pharmacy has been consulted for Vancomycin and Zosyn dosing.  Patient had low grade fevers yesterday  Plan:  Continue Zosyn extended infusion as ordered  Will increase vancomycin to 1g IV q12 hr given improved renal function and continued low-grade fevers (est AUC 534 based on SCr 0.69)  Measure vancomycin AUC at steady state as indicated  Height: 5\' 4"  (162.6 cm) Weight: 209 lb 14.1 oz (95.2 kg) IBW/kg (Calculated) : 54.7  Temp (24hrs), Avg:99.7 F (37.6 C), Min:98.8 F (37.1 C), Max:100.6 F (38.1 C)  Recent Labs  Lab 02/24/18 2110 02/24/18 2222 02/25/18 1053 02/26/18 0411 02/27/18 0546  WBC 11.2*  --  9.9 10.8* 13.4*  CREATININE 0.91  --  0.97 0.92 0.69  LATICACIDVEN  --  1.67  --   --   --     Estimated Creatinine Clearance: 102.5 mL/min (by C-G formula based on SCr of 0.69 mg/dL).    Allergies  Allergen Reactions  . Shellfish-Derived Products Swelling    Antimicrobials this admission:  3/28 Vancomycin  >> 3/28 Zosyn  >>   Dose adjustments this admission:   Microbiology results:  3/27 BCx: ngtd 3/27 UCx: insig growth   Thank you for allowing pharmacy to be a part of this patient's care.  Bernadene Personrew Oneita Allmon, PharmD, BCPS [phone] 02/27/2018, 1:08 PM

## 2018-02-28 LAB — BPAM RBC
Blood Product Expiration Date: 201904232359
Blood Product Expiration Date: 201904242359
ISSUE DATE / TIME: 201903292144
ISSUE DATE / TIME: 201903300110
UNIT TYPE AND RH: 5100
Unit Type and Rh: 5100

## 2018-02-28 LAB — TYPE AND SCREEN
ABO/RH(D): O POS
ANTIBODY SCREEN: NEGATIVE
UNIT DIVISION: 0
Unit division: 0

## 2018-02-28 LAB — HEMOGLOBIN AND HEMATOCRIT, BLOOD
HEMATOCRIT: 29 % — AB (ref 36.0–46.0)
Hemoglobin: 9.5 g/dL — ABNORMAL LOW (ref 12.0–15.0)

## 2018-02-28 LAB — CREATININE, SERUM
Creatinine, Ser: 0.78 mg/dL (ref 0.44–1.00)
GFR calc Af Amer: >60 mL/min (ref >60)

## 2018-02-28 NOTE — Progress Notes (Signed)
PROGRESS NOTE    [facility]  RUE:[id number] DOB: 02/04/1976 DOA: 02/24/2018 PCP: Patient, No Pcp Per   Brief Narrative:   Hannah Sanders is a 42 y.o. female with medical history significant of hypertension, history of hep C, depression, breast enlargement, obstructive sleep apnea had Brazilian [facility].  Patient was given a prescription for Keflex on the same day of the procedure as a prophylaxis.  He presents with worsening pain and fever and symptomatic anemia.    Assessment & Plan:   Active Problems:   Sepsis (HCC)   On admission patient was febrile slightly tachycardic hypotensive, leukocytosis but with normal lactic acid and findings of cellulitis. Sepsis probably secondary to cellulitis of the buttocks s/p butt lift.  Blood cultures pending and negative so far.  On IV vancomycin and zosyn completed 4 days of treatment. Transition to oral antibiotics in am if cellulitis improves. CT abdomen and pelvis does not show any areas of abscess or accumulation of fluid. Fevers have come down, but leukocytosis worsening. Repeat CBC in am.    Symptomatic anemia/anemia of blood loss post butt lift. As per the monitor at bedside her baseline hemoglobin is around 13 and she reports post surgery she lost a lot of blood.  Her hemoglobin at the urgent care was around 8.2.  On admission patient's hemoglobin was 8.2 today at 7.4.  Though there are no signs of active bleeding at this time suspect the drop in hemoglobin since admission is probably from hemodilution. Transfuse 2 units PRBC today keep hemoglobin greater than 7.  Reviewed anemia panel and patient's iron levels appear to be low.  Provide iron supplementation. Patient's stool for guiac is positive, but no signs of overt bleeding. Recommend outpatient work for guiac positive stools.  Hemoglobin is stable around 9     Headaches for 3 days; Severe, uncontrollable. CT head does nto show  any acute abnormality.  Prn ibuporfen.    Hypertension blood pressures are well controlled.       DVT prophylaxis: SCDs Code Status: Full code Family Communication: none.  at bedside Disposition Plan: home tomorrow if no fever and cellulitis improves.   Consultants:   None   Procedures: None   Antimicrobials: (Vancomycin and Zosyn since admission   Subjective: Headache controlled with pain meds.  She feels better.   Objective: Vitals:   02/27/18 1722 02/27/18 2123 02/28/18 0531 02/28/18 1500  BP:  118/66 115/82 103/65  Pulse:  90 91 87  Resp:  20 18 18   Temp: 98.9 F (37.2 C) 99.9 F (37.7 C) 99.5 F (37.5 C) 100.3 F (37.9 C)  TempSrc: Oral Oral Oral Oral  SpO2:  99% 98% 100%  Weight:      Height:        Intake/Output Summary (Last 24 hours) at 02/28/2018 1713 Last data filed at 02/28/2018 1100 Gross per 24 hour  Intake 1605 ml  Output 1 ml  Net 1604 ml   Filed Weights   02/25/18 0611 02/25/18 0718  Weight: 99.3 kg (219 lb) 95.2 kg (209 lb 14.1 oz)    Examination:  General exam: Appears calm and comfortable  Respiratory system: clear, no wheezing or rhonchi.  Cardiovascular system: S1 & S2 heard, RRR. No JVD, murmurs, . No pedal edema. Gastrointestinal system: Abdomenis soft NT ND bs+ Central nervous system: Alert and oriented. No focal neurological deficits. Extremities:  Erythema, swelling and tenderness over the buttocks  improved,   Incision sites  are good, no drainage Skin: No rashes, lesions or ulcers Psychiatry: JMood & affect appropriate.     Data Reviewed: I have personally reviewed following labs and imaging studies  CBC: Recent Labs  Lab 02/24/18 2110 02/25/18 0522 02/25/18 1053 02/26/18 0411 02/27/18 0546  WBC 11.2*  --  9.9 10.8* 13.4*  NEUTROABS 8.5*  --   --   --   --   HGB 8.6* 8.2* 8.2* 7.4* 8.9*  HCT 26.2* 24.8* 24.8* 22.5* 26.6*  MCV 94.2  --  93.6 94.1 92.0  PLT 373  --  347  353 369 358   Basic Metabolic  Panel: Recent Labs  Lab 02/24/18 2110 02/25/18 1053 02/26/18 0411 02/27/18 0546 02/28/18 0555  NA 137 140  --  139  --   K 3.7 3.5  --  3.9  --   CL 101 103  --  107  --   CO2 26 25  --  24  --   GLUCOSE 96 111*  --  102*  --   BUN 8 8  --  7  --   CREATININE 0.91 0.97 0.92 0.69 0.78  CALCIUM 8.6* 8.5*  --  8.2*  --    GFR: Estimated Creatinine Clearance: 102.5 mL/min (by C-G formula based on SCr of 0.78 mg/dL). Liver Function Tests: Recent Labs  Lab 02/24/18 2110 02/25/18 1053  AST 31 30  ALT 63* 53  ALKPHOS 138* 130*  BILITOT 1.0 1.2  PROT 7.6 6.9  ALBUMIN 3.4* 2.8*   No results for input(s): LIPASE, AMYLASE in the last 168 hours. No results for input(s): AMMONIA in the last 168 hours. Coagulation Profile: Recent Labs  Lab 02/24/18 2126 02/25/18 1053  INR 1.11 1.18  1.18   Cardiac Enzymes: No results for input(s): CKTOTAL, CKMB, CKMBINDEX, TROPONINI in the last 168 hours. BNP (last 3 results) No results for input(s): PROBNP in the last 8760 hours. HbA1C: No results for input(s): HGBA1C in the last 72 hours. CBG: No results for input(s): GLUCAP in the last 168 hours. Lipid Profile: No results for input(s): CHOL, HDL, LDLCALC, TRIG, CHOLHDL, LDLDIRECT in the last 72 hours. Thyroid Function Tests: No results for input(s): TSH, T4TOTAL, FREET4, T3FREE, THYROIDAB in the last 72 hours. Anemia Panel: No results for input(s): VITAMINB12, FOLATE, FERRITIN, TIBC, IRON, RETICCTPCT in the last 72 hours. Sepsis Labs: Recent Labs  Lab 02/24/18 2222  LATICACIDVEN 1.67    Recent Results (from the past 240 hour(s))  Urine culture     Status: Abnormal   Collection Time: 02/24/18  9:15 PM  Result Value Ref Range Status   Specimen Description   Final    URINE, CATHETERIZED Performed at [facility] [place] [facility], [address] [place], [place] [address]    Special Requests   Final    NONE Performed at [facility], [address]  [place], [place] [address]    Culture (A)  Final    <10,000 COLONIES/mL INSIGNIFICANT GROWTH Performed at [facility], [address] [place], [place] [address]    Report Status 02/26/2018 FINAL  Final  Culture, blood (routine x 2)     Status: None (Preliminary result)   Collection Time: 02/24/18  9:30 PM  Result Value Ref Range Status   Specimen Description   Final    BLOOD LEFT FOREARM Performed at [facility], [address] [place], [place] [address]    Special Requests   Final    BOTTLES  DRAWN AEROBIC AND ANAEROBIC Blood Culture adequate volume Performed at [facility], [address], [place], [place] [address]    Culture   Final    NO GROWTH 4 DAYS Performed at [facility], [address], [place], [place] [address]    Report Status PENDING  Incomplete  Culture, blood (routine x 2)     Status: None (Preliminary result)   Collection Time: 02/24/18  9:30 PM  Result Value Ref Range Status   Specimen Description   Final    BLOOD LEFT ARM Performed at [facility], [address] [place], [place] [address]    Special Requests   Final    BOTTLES DRAWN AEROBIC AND ANAEROBIC Blood Culture adequate volume Performed at [facility], [address] [place], [place] [address]    Culture   Final    NO GROWTH 4 DAYS Performed at [facility], [address] [place], [place] [address]    Report Status PENDING  Incomplete         Radiology Studies: Ct Head Wo Contrast  Result Date: 02/27/2018 CLINICAL DATA:  Headache, acute, severe, worst of life. Persistent headaches since surgery EXAM: CT HEAD WITHOUT CONTRAST TECHNIQUE: Contiguous axial images were obtained from the base of the skull through the vertex without intravenous contrast. COMPARISON:  None. FINDINGS: Brain: No evidence of acute infarction, hemorrhage, hydrocephalus, extra-axial collection or mass lesion/mass effect.  Vascular: No hyperdense vessel or unexpected calcification. Skull: Normal. Negative for fracture or focal lesion. Sinuses/Orbits: Negative IMPRESSION: Negative head CT.  No explanation for headache. Electronically Signed   By: Marnee Spring M.D.   On: 02/27/2018 11:56        Scheduled Meds: . docusate sodium  100 mg Oral BID  . ferrous sulfate  325 mg Oral BID WC   Continuous Infusions: . sodium chloride 75 mL/hr at 02/28/18 0459  . sodium chloride    . piperacillin-tazobactam (ZOSYN)  IV 3.375 g (02/28/18 1519)  . vancomycin Stopped (02/28/18 1003)     LOS: 3 days    Time spent: 35 minutes.     Kathlen Mody, MD [facility] Pager [phone]  If 7PM-7AM, please contact night-coverage [email or web address] Password [facility] 02/28/2018, 5:13 PM

## 2018-03-01 LAB — CBC
HCT: 24.9 % — ABNORMAL LOW (ref 36.0–46.0)
Hemoglobin: 8.2 g/dL — ABNORMAL LOW (ref 12.0–15.0)
MCH: 30.4 pg (ref 26.0–34.0)
MCHC: 32.9 g/dL (ref 30.0–36.0)
MCV: 92.2 fL (ref 78.0–100.0)
Platelets: 433 10*3/uL — ABNORMAL HIGH (ref 150–400)
RBC: 2.7 MIL/uL — AB (ref 3.87–5.11)
RDW: 15.3 % (ref 11.5–15.5)
WBC: 8.9 10*3/uL (ref 4.0–10.5)

## 2018-03-01 LAB — CULTURE, BLOOD (ROUTINE X 2)
Culture: NO GROWTH
Culture: NO GROWTH
SPECIAL REQUESTS: ADEQUATE
Special Requests: ADEQUATE

## 2018-03-01 LAB — CREATININE, SERUM
CREATININE: 0.84 mg/dL (ref 0.44–1.00)
GFR calc Af Amer: >60 mL/min (ref >60)
GFR calc non Af Amer: >60 mL/min (ref >60)

## 2018-03-01 NOTE — Progress Notes (Signed)
PROGRESS NOTE    Tannya Gonet [facility]  ZOX:[id number] DOB: 1976-08-26 DOA: 02/24/2018 PCP: Patient, No Pcp Per   Brief Narrative:   Brylyn Novakovich is a 42 y.o. female with medical history significant of hypertension, history of hep C, depression, breast enlargement, obstructive sleep apnea had Brazilian butt lift done on 02/15/2018 at [facility].  Patient was given a prescription for Keflex on the same day of the procedure as a prophylaxis.  He presents with worsening pain and fever and symptomatic anemia.    Assessment & Plan:   Active Problems:   Sepsis (HCC)   On admission patient was febrile slightly tachycardic hypotensive, leukocytosis but with normal lactic acid and findings of cellulitis. Sepsis probably secondary to cellulitis of the buttocks s/p butt lift.  Blood cultures pending and negative so far.  On IV vancomycin and zosyn completed 5 days of treatment.  CT abdomen and pelvis does not show any areas of abscess or accumulation of fluid. Fevers have come down, leukocytosis has resolved. But pt has new areas of erythema on her bottom, its indurated and tender. If it does not improve , will plan to call surgery in am to see if she needs I&D.    Symptomatic anemia/anemia of blood loss post butt lift. As per the monitor at bedside her baseline hemoglobin is around 13 and she reports post surgery she lost a lot of blood.  Her hemoglobin at the urgent care was around 8.2.  On admission patient's hemoglobin was 8.2 today at 7.4.  Though there are no signs of active bleeding at this time suspect the drop in hemoglobin since admission is probably from hemodilution. Transfuse 2 units PRBC today keep hemoglobin greater than 7.  Reviewed anemia panel and patient's iron levels appear to be low.  Provide iron supplementation. Patient's stool for guiac is positive, but no signs of overt bleeding. Recommend outpatient work for guiac positive stools.  Hemoglobin is stable between 8  to 9.     Headaches for 3 days; Severe, uncontrollable. CT head does nto show any acute abnormality.  Prn ibuporfen.    Hypertension blood pressures are well controlled.       DVT prophylaxis: SCDs Code Status: Full code Family Communication: none.  at bedside Disposition Plan: home if cellulitis improves.   Consultants:   None   Procedures: None   Antimicrobials: (Vancomycin and Zosyn since admission   Subjective: Headache better, the pain in the buttocks remain the same.  Objective: Vitals:   02/28/18 1500 02/28/18 2141 03/01/18 0541 03/01/18 1426  BP: 103/65 124/79 113/64 121/78  Pulse: 87 91 85 84  Resp: 18 18 18 16   Temp: 100.3 F (37.9 C) 100.3 F (37.9 C) 99.2 F (37.3 C) 98.8 F (37.1 C)  TempSrc: Oral Oral Oral Oral  SpO2: 100% 100% 95% 100%  Weight:      Height:        Intake/Output Summary (Last 24 hours) at 03/01/2018 1755 Last data filed at 03/01/2018 0512 Gross per 24 hour  Intake 1350 ml  Output -  Net 1350 ml   Filed Weights   02/25/18 0611 02/25/18 0718  Weight: 99.3 kg (219 lb) 95.2 kg (209 lb 14.1 oz)    Examination:  General exam: Appears calm and comfortable  Respiratory system: clear, no wheezing or rhonchi.  Cardiovascular system: S1 & S2 heard, RRR. No JVD, murmurs, . No pedal edema. Gastrointestinal system: Abdomenis soft NT ND bs+ Central nervous system: Alert and oriented. No  focal neurological deficits. Extremities:  New aread of  Erythema, swelling and tenderness with induration over the buttocks  ,   Incision sites are good, no drainage Skin: No rashes, lesions or ulcers Psychiatry: JMood & affect appropriate.     Data Reviewed: I have personally reviewed following labs and imaging studies  CBC: Recent Labs  Lab 02/24/18 2110  02/25/18 1053 02/26/18 0411 02/27/18 0546 02/28/18 1751 03/01/18 0546  WBC 11.2*  --  9.9 10.8* 13.4*  --  8.9  NEUTROABS 8.5*  --   --   --   --   --   --   HGB 8.6*   < > 8.2*  7.4* 8.9* 9.5* 8.2*  HCT 26.2*   < > 24.8* 22.5* 26.6* 29.0* 24.9*  MCV 94.2  --  93.6 94.1 92.0  --  92.2  PLT 373  --  347  353 369 358  --  433*   < > = values in this interval not displayed.   Basic Metabolic Panel: Recent Labs  Lab 02/24/18 2110 02/25/18 1053 02/26/18 0411 02/27/18 0546 02/28/18 0555 03/01/18 0546  NA 137 140  --  139  --   --   K 3.7 3.5  --  3.9  --   --   CL 101 103  --  107  --   --   CO2 26 25  --  24  --   --   GLUCOSE 96 111*  --  102*  --   --   BUN 8 8  --  7  --   --   CREATININE 0.91 0.97 0.92 0.69 0.78 0.84  CALCIUM 8.6* 8.5*  --  8.2*  --   --    GFR: Estimated Creatinine Clearance: 97.7 mL/min (by C-G formula based on SCr of 0.84 mg/dL). Liver Function Tests: Recent Labs  Lab 02/24/18 2110 02/25/18 1053  AST 31 30  ALT 63* 53  ALKPHOS 138* 130*  BILITOT 1.0 1.2  PROT 7.6 6.9  ALBUMIN 3.4* 2.8*   No results for input(s): LIPASE, AMYLASE in the last 168 hours. No results for input(s): AMMONIA in the last 168 hours. Coagulation Profile: Recent Labs  Lab 02/24/18 2126 02/25/18 1053  INR 1.11 1.18  1.18   Cardiac Enzymes: No results for input(s): CKTOTAL, CKMB, CKMBINDEX, TROPONINI in the last 168 hours. BNP (last 3 results) No results for input(s): PROBNP in the last 8760 hours. HbA1C: No results for input(s): HGBA1C in the last 72 hours. CBG: No results for input(s): GLUCAP in the last 168 hours. Lipid Profile: No results for input(s): CHOL, HDL, LDLCALC, TRIG, CHOLHDL, LDLDIRECT in the last 72 hours. Thyroid Function Tests: No results for input(s): TSH, T4TOTAL, FREET4, T3FREE, THYROIDAB in the last 72 hours. Anemia Panel: No results for input(s): VITAMINB12, FOLATE, FERRITIN, TIBC, IRON, RETICCTPCT in the last 72 hours. Sepsis Labs: Recent Labs  Lab 02/24/18 2222  LATICACIDVEN 1.67    Recent Results (from the past 240 hour(s))  Urine culture     Status: Abnormal   Collection Time: 02/24/18  9:15 PM  Result  Value Ref Range Status   Specimen Description   Final    URINE, CATHETERIZED Performed at [facility], [address], [place], [place] [address]    Special Requests   Final    NONE Performed at [facility], [address], [place], [place] [address]    Culture (A)  Final    <10,000 COLONIES/mL  INSIGNIFICANT GROWTH Performed at [facility], [address] [place], [place] [address]    Report Status 02/26/2018 FINAL  Final  Culture, blood (routine x 2)     Status: None   Collection Time: 02/24/18  9:30 PM  Result Value Ref Range Status   Specimen Description   Final    BLOOD LEFT FOREARM Performed at [facility], [address] [place], [place] [address]    Special Requests   Final    BOTTLES DRAWN AEROBIC AND ANAEROBIC Blood Culture adequate volume Performed at [facility], [address] [place], [place] [address]    Culture   Final    NO GROWTH 5 DAYS Performed at [facility], [address] [place], [place] [address]    Report Status 03/01/2018 FINAL  Final  Culture, blood (routine x 2)     Status: None   Collection Time: 02/24/18  9:30 PM  Result Value Ref Range Status   Specimen Description   Final    BLOOD LEFT ARM Performed at [facility], [address] [place], [place] [address]    Special Requests   Final    BOTTLES DRAWN AEROBIC AND ANAEROBIC Blood Culture adequate volume Performed at [facility], [address] [place], [place] [address]    Culture   Final    NO GROWTH 5 DAYS Performed at [facility], [address] [place], [place] [address]    Report Status 03/01/2018 FINAL  Final         Radiology Studies: No results found.      Scheduled Meds: . docusate sodium  100 mg Oral BID  . ferrous sulfate  325 mg Oral BID WC   Continuous Infusions: . sodium chloride 75 mL/hr at 02/28/18 2148  .  piperacillin-tazobactam (ZOSYN)  IV 3.375 g (03/01/18 1400)  . vancomycin Stopped (03/01/18 1106)     LOS: 4 days    Time spent: 35 minutes.     Kathlen ModyVijaya Lashanda Storlie, MD [facility] Pager [phone]  If 7PM-7AM, please contact night-coverage [email or web address] Password [email or web address] 03/01/2018, 5:55 PM

## 2018-03-02 ENCOUNTER — Inpatient Hospital Stay (HOSPITAL_COMMUNITY): Payer: 59

## 2018-03-02 LAB — VANCOMYCIN, TROUGH: VANCOMYCIN TR: 10 ug/mL — AB (ref 15–20)

## 2018-03-02 LAB — CREATININE, SERUM
CREATININE: 0.99 mg/dL (ref 0.44–1.00)
GFR calc Af Amer: >60 mL/min (ref >60)

## 2018-03-02 LAB — VANCOMYCIN, PEAK: VANCOMYCIN PK: 22 ug/mL — AB (ref 30–40)

## 2018-03-02 MED ORDER — FLUCONAZOLE 100 MG PO TABS
100.0000 mg | ORAL_TABLET | Freq: Every day | ORAL | Status: DC
  Administered 2018-03-02 – 2018-03-04 (×3): 100 mg via ORAL
  Filled 2018-03-02 (×3): qty 1

## 2018-03-02 MED ORDER — ONDANSETRON HCL 4 MG/2ML IJ SOLN
4.0000 mg | Freq: Four times a day (QID) | INTRAMUSCULAR | Status: DC | PRN
  Administered 2018-03-02 – 2018-03-03 (×2): 4 mg via INTRAVENOUS
  Filled 2018-03-02 (×2): qty 2

## 2018-03-02 MED ORDER — HYDROCHLOROTHIAZIDE 25 MG PO TABS
25.0000 mg | ORAL_TABLET | Freq: Once | ORAL | Status: AC
  Administered 2018-03-02: 25 mg via ORAL
  Filled 2018-03-02: qty 1

## 2018-03-02 MED ORDER — NYSTATIN 100000 UNIT/GM EX POWD
Freq: Three times a day (TID) | CUTANEOUS | Status: DC
  Administered 2018-03-02 – 2018-03-04 (×6): via TOPICAL
  Filled 2018-03-02: qty 15

## 2018-03-02 MED ORDER — SODIUM CHLORIDE 0.9 % IV SOLN
1250.0000 mg | Freq: Two times a day (BID) | INTRAVENOUS | Status: DC
  Administered 2018-03-03: 1250 mg via INTRAVENOUS
  Filled 2018-03-02 (×2): qty 1250

## 2018-03-02 NOTE — Progress Notes (Signed)
Pharmacy Antibiotic Note  Hannah Sanders is a 42 y.o. female admitted on 02/24/2018 with sepsis.  Pharmacy has been consulted for Vancomycin and Zosyn dosing.  03/02/2018  WBC 8.9 Scr 0.99, CrCl ~ 1883mls/min Low grade fevers New areas of erythema on bottom Vpk=22 and VT=10 with est AUC =407 on 1 Gm IV q12h   Plan:  Will increase Vancomycin to 1250 mg IV q12h for est AUC =509  Continue Zosyn extended infusion as ordered  Daily Scr  F/u on recs from plastics  Height: 5\' 4"  (162.6 cm) Weight: 209 lb 14.1 oz (95.2 kg) IBW/kg (Calculated) : 54.7  Temp (24hrs), Avg:98.7 F (37.1 C), Min:98.2 F (36.8 C), Max:98.9 F (37.2 C)  Recent Labs  Lab 02/24/18 2110 02/24/18 2222 02/25/18 1053 02/26/18 0411 02/27/18 0546 02/28/18 0555 03/01/18 0546 03/02/18 0610 03/02/18 1226 03/02/18 2104  WBC 11.2*  --  9.9 10.8* 13.4*  --  8.9  --   --   --   CREATININE 0.91  --  0.97 0.92 0.69 0.78 0.84 0.99  --   --   LATICACIDVEN  --  1.67  --   --   --   --   --   --   --   --   VANCOTROUGH  --   --   --   --   --   --   --   --   --  10*  VANCOPEAK  --   --   --   --   --   --   --   --  22*  --     Estimated Creatinine Clearance: 82.9 mL/min (by C-G formula based on SCr of 0.99 mg/dL).    Allergies  Allergen Reactions  . Shellfish-Derived Products Swelling    Antimicrobials this admission:  3/28 Vancomycin  >> 3/28 Zosyn  >>   Dose adjustments this admission:   Microbiology results:  3/27 BCx: ngtf 3/27 UCx: insig growth   Thank you for allowing pharmacy to be a part of this patient's care.  Lorenza EvangelistGreen, Caddie Randle R 03/02/2018, 11:05 PM Pager [phone]

## 2018-03-02 NOTE — Consult Note (Addendum)
Reason for Consult: cellulitis buttock s/p fat grafting Referring Physician: Venia Minks MD Location: [facility] Date: 4.2.2019  Hannah Sanders is an 42 y.o. female.  HPI: Patient admitted 3.27.19 for fever, anemia, cellulitis following autologous fat grafting buttock in [place] with Acquanetta Sit MD on 3.18.19. Patient received 2 u PRBC on admission and responded appropriately. She continues on Vancocin and Zosyn and has improved with no fever over last 24 hours. CT AP on admission without evidence abscess. Plastic surgery consulted for new induration area left buttock, need for surgical intervention.  PMH significant for Hep C, completed treatment at [facility]. Patient works in [employer], educational.  Has had breast reduction with Dr. Dessie Coma, states need revision and this is reason for having surgery in [place]. She has operative note emailed to her by primary surgeon on her phone. Reports indicated liposcution performed to abdomen, flanks, bra area, waist. Total 1500 fluid aspirated, 4000 ml fat aspirated. Report states 1320 ml fat into both right and left buttock. States she had outpatient surgery. Received massage to buttocks post operatively and notes this was associated with significant bleeding. No scheduled follow up with primary surgery.   Past Medical History:  Diagnosis Date  . Allergy   . Depression   . Hepatitis   . Hypertension   . Liver disease    Diagnosed With Hep C    Past Surgical History:  Procedure Laterality Date  . ABDOMINAL SURGERY  05/2016   ABDOMINOPLASTY   . BREAST SURGERY  05/2016   BREAST REDUCTION  . CHOLECYSTECTOMY, LAPAROSCOPIC      Family History  Problem Relation Age of Onset  . Heart disease Mother   . Hypertension Mother   . Diabetes Mother   . Heart disease Father   . Hypertension Father   . Diabetes Father   . Heart disease Brother   . Diabetes Daughter   . Heart disease Maternal Grandmother   . Hypertension Maternal Grandmother   .  Diabetes Maternal Grandfather   . Diabetes Paternal Grandmother   . Diabetes Maternal Aunt   . Diabetes Maternal Uncle   . Diabetes Paternal Aunt   . Diabetes Paternal Grandfather     Social History:  reports that she has never smoked. She has never used smokeless tobacco. She reports that she drinks alcohol. She reports that she does not use drugs.  Allergies:  Allergies  Allergen Reactions  . Shellfish-Derived Products Swelling    Medications: I have reviewed the patient's current medications.  Results for orders placed or performed during the hospital encounter of 02/24/18 (from the past 48 hour(s))  Hemoglobin and hematocrit, blood     Status: Abnormal   Collection Time: 02/28/18  5:51 PM  Result Value Ref Range   Hemoglobin 9.5 (L) 12.0 - 15.0 g/dL   HCT 29.0 (L) 36.0 - 46.0 %    Comment: Performed at [facility], [place] [address], [place], [place] [address]  Creatinine, serum     Status: None   Collection Time: 03/01/18  5:46 AM  Result Value Ref Range   Creatinine, Ser 0.84 0.44 - 1.00 mg/dL   GFR calc non Af Amer >60 >60 mL/min   GFR calc Af Amer >60 >60 mL/min    Comment: (NOTE) The eGFR has been calculated using the CKD EPI equation. This calculation has not been validated in all clinical situations. eGFR's persistently <60 mL/min signify possible Chronic Kidney Disease. Performed at [facility], [address] [place], [place]  [address]   CBC     Status: Abnormal   Collection Time: 03/01/18  5:46 AM  Result Value Ref Range   WBC 8.9 4.0 - 10.5 K/uL   RBC 2.70 (L) 3.87 - 5.11 MIL/uL   Hemoglobin 8.2 (L) 12.0 - 15.0 g/dL   HCT 24.9 (L) 36.0 - 46.0 %   MCV 92.2 78.0 - 100.0 fL   MCH 30.4 26.0 - 34.0 pg   MCHC 32.9 30.0 - 36.0 g/dL   RDW 15.3 11.5 - 15.5 %   Platelets 433 (H) 150 - 400 K/uL    Comment: Performed at [facility], [place] [address], [place], [place] [address]  Creatinine, serum     Status:  None   Collection Time: 03/02/18  6:10 AM  Result Value Ref Range   Creatinine, Ser 0.99 0.44 - 1.00 mg/dL   GFR calc non Af Amer >60 >60 mL/min   GFR calc Af Amer >60 >60 mL/min    Comment: (NOTE) The eGFR has been calculated using the CKD EPI equation. This calculation has not been validated in all clinical situations. eGFR's persistently <60 mL/min signify possible Chronic Kidney Disease. Performed at [facility], [place] [address] [place], [place] [address]      ROS Blood pressure 127/84, pulse 80, temperature 98.9 F (37.2 C), temperature source Oral, resp. rate 18, height '5\' 4"'  (1.626 m), weight 95.2 kg (209 lb 14.1 oz), last menstrual period 01/25/2018, SpO2 100 %. Physical Exam Gen: alert NAD Abd: prior abdominoplasty incisions, soft, healing port sites from liposuction no drainage no cellulitis Back/buttocks: gluteal cleft and upper abdomen incisions right and left, lower buttock incisions right and left, all intact without drainage. Area of concerns if left UO buttock that is indurated and faint erythema, TTP over area and in general over all buttocks. Buttocks bilateral are very firm/indurated consistent with stated procedure  Assessment/Plan: High volume liposuction with resultant anemia- responded appropriately to transfusion  Cellulitis buttock post high volume fat grafting- area of concern with erythema but firm, no clear abscess clinically that could be drained. CT AP on admission did include this area, personally reviewed and no evidence abscess on this. If patient tolerates, recommend [place] over area to evaluate for any drainable collection. If she is not able to tolerate [place] then consider repeat CT. Clinically appears to be significant improvement since admission.   Will follow.  Irene Limbo, MD [facility] Surgery [phone], pin [phone]  ADDENDUM 1611 4.2.2019 Spoke with radiology. Two small fluid collections as noted  appears to be 4-5 cm from  skin surface. Reporting physician feels may be amenable to US guided aspiration, though fluid appears dense/thick.   Will reexamine patient in am- if clinically worse then would plan attempt at US guided aspiration.

## 2018-03-02 NOTE — Progress Notes (Signed)
PROGRESS NOTE    Hannah Sanders [facility]  FAO:[id number] DOB: 03-08-1976 DOA: 02/24/2018 PCP: Patient, No Pcp Per   Brief Narrative:   Hannah Sanders is a 42 y.o. female with medical history significant of hypertension, history of hep C, depression, breast enlargement, obstructive sleep apnea had Brazilian butt lift done on 02/15/2018 at [facility].  Patient was given a prescription for Keflex on the same day of the procedure as a prophylaxis.  He presents with worsening pain and fever and symptomatic anemia.    Assessment & Plan:   Active Problems:   Sepsis (HCC)   On admission patient was febrile slightly tachycardic hypotensive, leukocytosis but with normal lactic acid and findings of cellulitis. Sepsis probably secondary to cellulitis of the buttocks s/p butt lift.  Blood cultures pending and negative so far.  On IV vancomycin and zosyn completed 6 days of treatment. Will continue another 24 hours.  CT abdomen and pelvis on admission  does not show any areas of abscess or accumulation of fluid.  though her Fevers have come down, leukocytosis has resolved,  pt has new areas of erythema on her bottom, its indurated and tender. Called plastic surgery for possible I&D of abscesses.  [place] buttocks ordered. It showed Two hypoechoic collections with increased echogenicity surrounding concerning for abscess. She will probably need aspiration vs I&D of the abscesses.  Symptomatic anemia/anemia of blood loss post butt lift. As per the monitor at bedside her baseline hemoglobin is around 13 and she reports post surgery she lost a lot of blood.  Her hemoglobin at the urgent care was around 8.2.  On admission patient's hemoglobin was 8.2 today at 7.4.  Though there are no signs of active bleeding at this time suspect the drop in hemoglobin since admission is probably from hemodilution. Transfuse 2 units PRBC today keep hemoglobin greater than 7.  Reviewed anemia panel and patient's iron  levels appear to be low.  Provided iron supplementation. Patient's stool for guiac is positive, but no signs of overt bleeding. Recommend outpatient work for guiac positive stools.  Hemoglobin is stable between 8 to 9.     Headaches for 3 days; Severe, uncontrollable. CT head does nto show any acute abnormality.  Prn ibuporfen. Headaches are much better today.    Hypertension blood pressures are sub optimmaly controlled.  Asymptomatic.      DVT prophylaxis: SCDs Code Status: Full code Family Communication: none.  at bedside Disposition Plan: pending evaluation of abscess on the bottom.   Consultants:   Plastic surgery.    Procedures: None   Antimicrobials: (Vancomycin and Zosyn since admission   Subjective:  worsening pain in the buttocks. Unable to sit in the chair for long.  Objective: Vitals:   03/01/18 2019 03/01/18 2344 03/02/18 0500 03/02/18 1629  BP: 121/71  127/84 (!) 152/75  Pulse: 95  80 79  Resp: 17  18 (!) 24  Temp: 100 F (37.8 C) 98.9 F (37.2 C) 98.9 F (37.2 C) 98.2 F (36.8 C)  TempSrc: Oral  Oral Oral  SpO2: 100%  100% 99%  Weight:      Height:       No intake or output data in the 24 hours ending 03/02/18 1809 Filed Weights   02/25/18 0611 02/25/18 0718  Weight: 99.3 kg (219 lb) 95.2 kg (209 lb 14.1 oz)    Examination:  General exam: Appears uncomfortable,  Respiratory system: good air entry bilateral, no wheezing or rhonchi.  Cardiovascular system: S1 & S2  heard, RRR. No JVD, murmurs, . No pedal edema. Gastrointestinal system: Abdomenis soft non tender non distended bowel sounds heard.  Central nervous system: Alert and oriented. Non focal Extremities:  New areas of  Erythema, swelling and tenderness with induration over the buttocks  Slightly worsened when compared to yesterday ,   Incision sites are good, no drainage Skin: see above. Psychiatry: Mood & affect appropriate.     Data Reviewed: I have personally reviewed following  labs and imaging studies  CBC: Recent Labs  Lab 02/24/18 2110  02/25/18 1053 02/26/18 0411 02/27/18 0546 02/28/18 1751 03/01/18 0546  WBC 11.2*  --  9.9 10.8* 13.4*  --  8.9  NEUTROABS 8.5*  --   --   --   --   --   --   HGB 8.6*   < > 8.2* 7.4* 8.9* 9.5* 8.2*  HCT 26.2*   < > 24.8* 22.5* 26.6* 29.0* 24.9*  MCV 94.2  --  93.6 94.1 92.0  --  92.2  PLT 373  --  347  353 369 358  --  433*   < > = values in this interval not displayed.   Basic Metabolic Panel: Recent Labs  Lab 02/24/18 2110 02/25/18 1053 02/26/18 0411 02/27/18 0546 02/28/18 0555 03/01/18 0546 03/02/18 0610  NA 137 140  --  139  --   --   --   K 3.7 3.5  --  3.9  --   --   --   CL 101 103  --  107  --   --   --   CO2 26 25  --  24  --   --   --   GLUCOSE 96 111*  --  102*  --   --   --   BUN 8 8  --  7  --   --   --   CREATININE 0.91 0.97 0.92 0.69 0.78 0.84 0.99  CALCIUM 8.6* 8.5*  --  8.2*  --   --   --    GFR: Estimated Creatinine Clearance: 82.9 mL/min (by C-G formula based on SCr of 0.99 mg/dL). Liver Function Tests: Recent Labs  Lab 02/24/18 2110 02/25/18 1053  AST 31 30  ALT 63* 53  ALKPHOS 138* 130*  BILITOT 1.0 1.2  PROT 7.6 6.9  ALBUMIN 3.4* 2.8*   No results for input(s): LIPASE, AMYLASE in the last 168 hours. No results for input(s): AMMONIA in the last 168 hours. Coagulation Profile: Recent Labs  Lab 02/24/18 2126 02/25/18 1053  INR 1.11 1.18  1.18   Cardiac Enzymes: No results for input(s): CKTOTAL, CKMB, CKMBINDEX, TROPONINI in the last 168 hours. BNP (last 3 results) No results for input(s): PROBNP in the last 8760 hours. HbA1C: No results for input(s): HGBA1C in the last 72 hours. CBG: No results for input(s): GLUCAP in the last 168 hours. Lipid Profile: No results for input(s): CHOL, HDL, LDLCALC, TRIG, CHOLHDL, LDLDIRECT in the last 72 hours. Thyroid Function Tests: No results for input(s): TSH, T4TOTAL, FREET4, T3FREE, THYROIDAB in the last 72 hours. Anemia  Panel: No results for input(s): VITAMINB12, FOLATE, FERRITIN, TIBC, IRON, RETICCTPCT in the last 72 hours. Sepsis Labs: Recent Labs  Lab 02/24/18 2222  LATICACIDVEN 1.67    Recent Results (from the past 240 hour(s))  Urine culture     Status: Abnormal   Collection Time: 02/24/18  9:15 PM  Result Value Ref Range Status   Specimen Description   Final  URINE, CATHETERIZED Performed at [facility] [address], [place], [place] [address]    Special Requests   Final    NONE Performed at [facility], [address] [place], [place] [address]    Culture (A)  Final    <10,000 COLONIES/mL INSIGNIFICANT GROWTH Performed at [facility], [address] [place], [place] [address]    Report Status 02/26/2018 FINAL  Final  Culture, blood (routine x 2)     Status: None   Collection Time: 02/24/18  9:30 PM  Result Value Ref Range Status   Specimen Description   Final    BLOOD LEFT FOREARM Performed at [facility], [address], [place], [place] [address]    Special Requests   Final    BOTTLES DRAWN AEROBIC AND ANAEROBIC Blood Culture adequate volume Performed at [facility], [address] [place], [place] [address]    Culture   Final    NO GROWTH 5 DAYS Performed at [facility], [address], [place], [place] [address]    Report Status 03/01/2018 FINAL  Final  Culture, blood (routine x 2)     Status: None   Collection Time: 02/24/18  9:30 PM  Result Value Ref Range Status   Specimen Description   Final    BLOOD LEFT ARM Performed at [facility], [address], [place], [place] [address]    Special Requests   Final    BOTTLES DRAWN AEROBIC AND ANAEROBIC Blood Culture adequate volume Performed at [facility], [address] [place], [place] [address]    Culture   Final    NO GROWTH 5 DAYS Performed at [facility], [address], [place], [place] [address]    Report Status 03/01/2018 FINAL  Final         Radiology Studies: [place] Lt Lower Extrem Ltd Soft Tissue Non Vascular  Result Date: 03/02/2018 CLINICAL DATA:  Status post plastic surgery. Abnormal CT scan with abscess. EXAM: ULTRASOUND left buttocks, LOWER EXTREMITY LIMITED TECHNIQUE: Ultrasound examination of the lower extremity soft tissues was performed in the area of clinical concern. COMPARISON:  None. FINDINGS: Skin thickening is evident as on CT. 2 hypoechoic fluid collections are present. These measure 1.7 x 1.4 x 2.2 cm and 3.8 x 1.5 x 3.1 cm respectively. IMPRESSION: 1. Two hypoechoic collections with increased echogenicity surrounding concerning for abscess. Measurements are as above. Electronically Signed   By: Marin Roberts M.D.   On: 03/02/2018 14:42        Scheduled Meds: . docusate sodium  100 mg Oral BID  . ferrous sulfate  325 mg Oral BID WC  . fluconazole  100 mg Oral Daily  . nystatin   Topical TID   Continuous Infusions: . sodium chloride 75 mL/hr at 02/28/18 2148  . piperacillin-tazobactam (ZOSYN)  IV 3.375 g (03/02/18 1456)  . vancomycin Stopped (03/02/18 1018)     LOS: 5 days    Time spent: 35 minutes.     Kathlen Mody, MD [facility] Pager [phone]  If 7PM-7AM, please contact night-coverage [email or web address] Password [facility] 03/02/2018, 6:09 PM

## 2018-03-02 NOTE — Progress Notes (Signed)
Pharmacy Antibiotic Note  Hannah Sanders is a 42 y.o. female admitted on 02/24/2018 with sepsis.  Pharmacy has been consulted for Vancomycin and Zosyn dosing.  03/02/2018  WBC 8.9 Scr 0.99, CrCl ~ 3883mls/min Low grade fevers New areas of erythema on bottom  Plan:  Obtain vancomycin peak and trough and adjust dose accordingly currently on 1gm IV q12h  Continue Zosyn extended infusion as ordered  Daily Scr  F/u on recs from plastics  Height: 5\' 4"  (162.6 cm) Weight: 209 lb 14.1 oz (95.2 kg) IBW/kg (Calculated) : 54.7  Temp (24hrs), Avg:99.2 F (37.3 C), Min:98.8 F (37.1 C), Max:100 F (37.8 C)  Recent Labs  Lab 02/24/18 2110 02/24/18 2222 02/25/18 1053 02/26/18 0411 02/27/18 0546 02/28/18 0555 03/01/18 0546 03/02/18 0610  WBC 11.2*  --  9.9 10.8* 13.4*  --  8.9  --   CREATININE 0.91  --  0.97 0.92 0.69 0.78 0.84 0.99  LATICACIDVEN  --  1.67  --   --   --   --   --   --     Estimated Creatinine Clearance: 82.9 mL/min (by C-G formula based on SCr of 0.99 mg/dL).    Allergies  Allergen Reactions  . Shellfish-Derived Products Swelling    Antimicrobials this admission:  3/28 Vancomycin  >> 3/28 Zosyn  >>   Dose adjustments this admission:   Microbiology results:  3/27 BCx: ngtf 3/27 UCx: insig growth   Thank you for allowing pharmacy to be a part of this patient's care.  Arley PhenixEllen Chinara Hertzberg RPh 03/02/2018, 1:30 PM Pager [phone]

## 2018-03-03 ENCOUNTER — Inpatient Hospital Stay (HOSPITAL_COMMUNITY): Payer: 59

## 2018-03-03 DIAGNOSIS — R5082 Postprocedural fever: Secondary | ICD-10-CM

## 2018-03-03 DIAGNOSIS — D649 Anemia, unspecified: Secondary | ICD-10-CM

## 2018-03-03 DIAGNOSIS — N179 Acute kidney failure, unspecified: Secondary | ICD-10-CM

## 2018-03-03 DIAGNOSIS — L0231 Cutaneous abscess of buttock: Secondary | ICD-10-CM

## 2018-03-03 DIAGNOSIS — L03317 Cellulitis of buttock: Secondary | ICD-10-CM

## 2018-03-03 LAB — CBC WITH DIFFERENTIAL/PLATELET
BASOS PCT: 1 %
Basophils Absolute: 0 10*3/uL (ref 0.0–0.1)
EOS ABS: 0.2 10*3/uL (ref 0.0–0.7)
Eosinophils Relative: 3 %
HEMATOCRIT: 27.8 % — AB (ref 36.0–46.0)
Hemoglobin: 8.8 g/dL — ABNORMAL LOW (ref 12.0–15.0)
Lymphocytes Relative: 19 %
Lymphs Abs: 1.5 10*3/uL (ref 0.7–4.0)
MCH: 30 pg (ref 26.0–34.0)
MCHC: 31.7 g/dL (ref 30.0–36.0)
MCV: 94.9 fL (ref 78.0–100.0)
MONO ABS: 1 10*3/uL (ref 0.1–1.0)
MONOS PCT: 12 %
NEUTROS ABS: 5.4 10*3/uL (ref 1.7–7.7)
Neutrophils Relative %: 65 %
Platelets: 508 10*3/uL — ABNORMAL HIGH (ref 150–400)
RBC: 2.93 MIL/uL — ABNORMAL LOW (ref 3.87–5.11)
RDW: 15.2 % (ref 11.5–15.5)
WBC: 8.2 10*3/uL (ref 4.0–10.5)

## 2018-03-03 LAB — BASIC METABOLIC PANEL
Anion gap: 8 (ref 5–15)
BUN: 9 mg/dL (ref 6–20)
CALCIUM: 8.7 mg/dL — AB (ref 8.9–10.3)
CO2: 29 mmol/L (ref 22–32)
CREATININE: 1.15 mg/dL — AB (ref 0.44–1.00)
Chloride: 104 mmol/L (ref 101–111)
GFR, EST NON AFRICAN AMERICAN: 58 mL/min — AB (ref >60)
Glucose, Bld: 99 mg/dL (ref 65–99)
Potassium: 3.7 mmol/L (ref 3.5–5.1)
SODIUM: 141 mmol/L (ref 135–145)

## 2018-03-03 LAB — CREATININE, SERUM
CREATININE: 1.03 mg/dL — AB (ref 0.44–1.00)
GFR calc Af Amer: >60 mL/min (ref >60)
GFR calc non Af Amer: >60 mL/min (ref >60)

## 2018-03-03 MED ORDER — TRAMADOL HCL 50 MG PO TABS
50.0000 mg | ORAL_TABLET | Freq: Four times a day (QID) | ORAL | Status: DC | PRN
  Administered 2018-03-04: 50 mg via ORAL
  Filled 2018-03-03: qty 1

## 2018-03-03 MED ORDER — TRAMADOL HCL 50 MG PO TABS: 50.0000 mg | ORAL_TABLET | Freq: Four times a day (QID) | ORAL | Status: DC | PRN

## 2018-03-03 NOTE — Progress Notes (Signed)
PROGRESS NOTE    Aniylah Avans [facility]  AVW:[id number] DOB: 1976/05/04 DOA: 02/24/2018 PCP: Patient, No Pcp Per   Brief Narrative:   Karlina Suares is a 42 y.o. female with medical history significant of hypertension, history of hep C, depression, breast enlargement, obstructive sleep apnea had Brazilian butt lift done on 02/15/2018 at [facility].  Patient was given a prescription for Keflex on the same day of the procedure as a prophylaxis.  He presents with worsening pain and fever and symptomatic anemia.    Assessment & Plan:   Active Problems:   Sepsis (HCC)   On admission patient was febrile slightly tachycardic hypotensive, leukocytosis but with normal lactic acid and findings of cellulitis. Sepsis probably secondary to cellulitis of the buttocks s/p butt lift.  Blood cultures pending and negative so far.  On IV vancomycin and zosyn completed 7 days . Will d/c vancomycin and keep her on zosyn.  CT abdomen and pelvis on admission  does not show any areas of abscess or accumulation of fluid.  though her Fevers have come down, leukocytosis has resolved,  pt has new areas of erythema on her bottom, its indurated and tender. Called plastic surgery for possible I&D of abscesses.  [place] buttocks ordered. It showed two hypoechoic collections with increased echogenicity surrounding concerning for abscess. Dr Leta Baptist with Plastic surgery recommends no surgical intervention at this time.   Symptomatic anemia/anemia of blood loss post butt lift. As per the mother at bedside, her baseline hemoglobin is around 13 and she reports post surgery she lost a lot of blood.  Her hemoglobin at the urgent care was around 8.2.  On admission patient's hemoglobin was 8.2  Though there are no signs of active bleeding at this time, suspect the drop in hemoglobin since admission is probably from hemodilution. Transfused 2 units PRBC today keep hemoglobin greater than 7.  Reviewed anemia panel and  patient's iron levels appear to be low.  Provided iron supplementation. Patient's stool for guiac is positive, but no signs of overt bleeding. Recommend outpatient work for guiac positive stools.  Hemoglobin is stable between 8 to 9.     Headaches for 3 days; Severe, uncontrollable. CT head does nto show any acute abnormality. sub optimal relief with ibuprofen.  She reports she has the headache starting in [place] before the surgery.  She denies any migraines before and no family h/o cerebral aneurysm or SAH.  Will get an MRI of the brain for further evaluation and follow up.     Hypertension blood pressures are sub optimmaly controlled.  Asymptomatic.  Pt reports she uses lasix prn for hypertension and pedal edema as needed.  Stop the IV fluids and if her creatinine improves , start her on lasix from tomorrow.    Mild ARF:  Suspect from vancomycin.  Vancomycin d/c today.  If her creatinine, improves we can start her on low dose lasix for pedal edema.     DVT prophylaxis: SCDs Code Status: Full code Family Communication: none.  at bedside Disposition Plan: pending evaluation of abscess on the bottom.   Consultants:   Plastic surgery.    Procedures: CT abd and pelvis and US of the soft tissues.   Antimicrobials: (Vancomycin and Zosyn for 7 days. Vancomycin d/c on 4/3 and resumed zosyn.   Subjective:   Headache is worse today, the pai in the buttocks is better.  Patient is concerned about the fluids in her feet.  Objective: Vitals:   03/02/18 1629 03/02/18 2200  03/03/18 0526 03/03/18 1403  BP: (!) 152/75 119/78 (!) 137/91 135/85  Pulse: 79 80 72 64  Resp: (!) 24 (!) 22 20 18   Temp: 98.2 F (36.8 C) 98.8 F (37.1 C) 98.6 F (37 C) 97.9 F (36.6 C)  TempSrc: Oral Oral Oral Oral  SpO2: 99% 99% 98% 98%  Weight:      Height:        Intake/Output Summary (Last 24 hours) at 03/03/2018 1406 Last data filed at 03/03/2018 0940 Gross per 24 hour  Intake 720 ml  Output -    Net 720 ml   Filed Weights   02/25/18 0611 02/25/18 0718  Weight: 99.3 kg (219 lb) 95.2 kg (209 lb 14.1 oz)    Examination:  General exam: Appears uncomfortable. From the headache, and anxious  Respiratory system: clear to auscultation, no wheezing or rhonchi.  Cardiovascular system: S1 & S2 heard, RRR. No JVD, murmurs, .  Gastrointestinal system: Abdomen is soft non tender non distended bowel sounds heard.  Central nervous system: Alert and oriented. Non focal Extremities:  New areas of  Erythema, swelling and tenderness with induration over the buttocks   Pedal edema present today.  Skin: see above. Psychiatry: anxious.     Data Reviewed: I have personally reviewed following labs and imaging studies  CBC: Recent Labs  Lab 02/24/18 2110  02/25/18 1053 02/26/18 0411 02/27/18 0546 02/28/18 1751 03/01/18 0546 03/03/18 0608  WBC 11.2*  --  9.9 10.8* 13.4*  --  8.9 8.2  NEUTROABS 8.5*  --   --   --   --   --   --  5.4  HGB 8.6*   < > 8.2* 7.4* 8.9* 9.5* 8.2* 8.8*  HCT 26.2*   < > 24.8* 22.5* 26.6* 29.0* 24.9* 27.8*  MCV 94.2  --  93.6 94.1 92.0  --  92.2 94.9  PLT 373  --  347  353 369 358  --  433* 508*   < > = values in this interval not displayed.   Basic Metabolic Panel: Recent Labs  Lab 02/24/18 2110 02/25/18 1053  02/27/18 0546 02/28/18 0555 03/01/18 0546 03/02/18 0610 03/03/18 0608  NA 137 140  --  139  --   --   --  141  K 3.7 3.5  --  3.9  --   --   --  3.7  CL 101 103  --  107  --   --   --  104  CO2 26 25  --  24  --   --   --  29  GLUCOSE 96 111*  --  102*  --   --   --  99  BUN 8 8  --  7  --   --   --  9  CREATININE 0.91 0.97   < > 0.69 0.78 0.84 0.99 1.15*  1.03*  CALCIUM 8.6* 8.5*  --  8.2*  --   --   --  8.7*   < > = values in this interval not displayed.   GFR: Estimated Creatinine Clearance: 79.6 mL/min (A) (by C-G formula based on SCr of 1.03 mg/dL (H)). Liver Function Tests: Recent Labs  Lab 02/24/18 2110 02/25/18 1053  AST 31  30  ALT 63* 53  ALKPHOS 138* 130*  BILITOT 1.0 1.2  PROT 7.6 6.9  ALBUMIN 3.4* 2.8*   No results for input(s): LIPASE, AMYLASE in the last 168 hours. No results for input(s): AMMONIA in the last  168 hours. Coagulation Profile: Recent Labs  Lab 02/24/18 2126 02/25/18 1053  INR 1.11 1.18  1.18   Cardiac Enzymes: No results for input(s): CKTOTAL, CKMB, CKMBINDEX, TROPONINI in the last 168 hours. BNP (last 3 results) No results for input(s): PROBNP in the last 8760 hours. HbA1C: No results for input(s): HGBA1C in the last 72 hours. CBG: No results for input(s): GLUCAP in the last 168 hours. Lipid Profile: No results for input(s): CHOL, HDL, LDLCALC, TRIG, CHOLHDL, LDLDIRECT in the last 72 hours. Thyroid Function Tests: No results for input(s): TSH, T4TOTAL, FREET4, T3FREE, THYROIDAB in the last 72 hours. Anemia Panel: No results for input(s): VITAMINB12, FOLATE, FERRITIN, TIBC, IRON, RETICCTPCT in the last 72 hours. Sepsis Labs: Recent Labs  Lab 02/24/18 2222  LATICACIDVEN 1.67    Recent Results (from the past 240 hour(s))  Urine culture     Status: Abnormal   Collection Time: 02/24/18  9:15 PM  Result Value Ref Range Status   Specimen Description   Final    URINE, CATHETERIZED Performed at [facility], [address] [place], [place] [address]    Special Requests   Final    NONE Performed at [facility], [address] [place], [place] [address]    Culture (A)  Final    <10,000 COLONIES/mL INSIGNIFICANT GROWTH Performed at [facility], [address], [place], [place] [address]    Report Status 02/26/2018 FINAL  Final  Culture, blood (routine x 2)     Status: None   Collection Time: 02/24/18  9:30 PM  Result Value Ref Range Status   Specimen Description   Final    BLOOD LEFT FOREARM Performed at [facility], [address] [place], [place] [address]    Special Requests   Final    BOTTLES  DRAWN AEROBIC AND ANAEROBIC Blood Culture adequate volume Performed at [facility], [address] [place], [place] [address]    Culture   Final    NO GROWTH 5 DAYS Performed at [facility], [address], [place], [place] [address]    Report Status 03/01/2018 FINAL  Final  Culture, blood (routine x 2)     Status: None   Collection Time: 02/24/18  9:30 PM  Result Value Ref Range Status   Specimen Description   Final    BLOOD LEFT ARM Performed at [facility], [address], [place], [place] [address]    Special Requests   Final    BOTTLES DRAWN AEROBIC AND ANAEROBIC Blood Culture adequate volume Performed at [facility], [address], [place], [place] [address]    Culture   Final    NO GROWTH 5 DAYS Performed at [facility], [address], [place], [place] [address]    Report Status 03/01/2018 FINAL  Final         Radiology Studies: [place] Lt Lower Extrem Ltd Soft Tissue Non Vascular  Result Date: 03/02/2018 CLINICAL DATA:  Status post plastic surgery. Abnormal CT scan with abscess. EXAM: ULTRASOUND left buttocks, LOWER EXTREMITY LIMITED TECHNIQUE: Ultrasound examination of the lower extremity soft tissues was performed in the area of clinical concern. COMPARISON:  None. FINDINGS: Skin thickening is evident as on CT. 2 hypoechoic fluid collections are present. These measure 1.7 x 1.4 x 2.2 cm and 3.8 x 1.5 x 3.1 cm respectively. IMPRESSION: 1. Two hypoechoic collections with increased echogenicity surrounding concerning for abscess. Measurements  are as above. Electronically Signed   By: Marin Robertshristopher  Mattern M.D.   On: 03/02/2018 14:42        Scheduled Meds: . docusate sodium  100 mg Oral BID  . ferrous sulfate  325 mg Oral BID WC  . fluconazole  100 mg Oral Daily  . nystatin   Topical TID   Continuous Infusions: . piperacillin-tazobactam (ZOSYN)  IV Stopped (03/03/18 0950)     LOS: 6 days     Time spent: 35 minutes.     Kathlen ModyVijaya Hershell Brandl, MD [facility] Pager [phone]  If 7PM-7AM, please contact night-coverage [email or web address] Password [email or web address] 03/03/2018, 2:06 PM

## 2018-03-03 NOTE — Progress Notes (Signed)
  Plastic Surgery  HD#8  No fevers overnight. Main c/o is HA. States no pain in buttock.  Temp:  [98.2 F (36.8 C)-98.8 F (37.1 C)] 98.6 F (37 C) (04/03 0526) Pulse Rate:  [72-80] 72 (04/03 0526) Resp:  [20-24] 20 (04/03 0526) BP: (119-152)/(75-91) 137/91 (04/03 0526) SpO2:  [98 %-99 %] 98 % (04/03 0526)   PE: Abdomen benign Buttock - left firm consistent with recent surgery, right area of erythema marked is lighter in color no increase in size  A/P Area erythema right buttock improved. As clinically no fevers, WBC yesterday down from prior, no plans for intervention at this time.  If patient develops fever or increased WBC would recommend US guided aspiration fluid. If unable to aspirate, would need surgical incision over area.  Glenna FellowsBrinda Glennis Montenegro, MD [facility] Plastic & Reconstructive Surgery [phone], pin [phone]

## 2018-03-04 DIAGNOSIS — L03317 Cellulitis of buttock: Secondary | ICD-10-CM

## 2018-03-04 DIAGNOSIS — R5082 Postprocedural fever: Secondary | ICD-10-CM

## 2018-03-04 DIAGNOSIS — L0231 Cutaneous abscess of buttock: Secondary | ICD-10-CM

## 2018-03-04 LAB — CREATININE, SERUM: Creatinine, Ser: 1.01 mg/dL — ABNORMAL HIGH (ref 0.44–1.00)

## 2018-03-04 MED ORDER — PIPERACILLIN-TAZOBACTAM 3.375 G IVPB 30 MIN
3.3750 g | Freq: Once | INTRAVENOUS | Status: AC
  Administered 2018-03-04: 3.375 g via INTRAVENOUS
  Filled 2018-03-04: qty 50

## 2018-03-04 MED ORDER — ACETAMINOPHEN 325 MG PO TABS: 650.0000 mg | ORAL_TABLET | Freq: Four times a day (QID) | ORAL | 0 refills | Status: DC | PRN

## 2018-03-04 MED ORDER — CEPHALEXIN 500 MG PO CAPS: 500.0000 mg | ORAL_CAPSULE | Freq: Three times a day (TID) | ORAL | 0 refills | Status: DC

## 2018-03-04 MED ORDER — DOXYCYCLINE HYCLATE 100 MG PO TABS: 100.0000 mg | ORAL_TABLET | Freq: Two times a day (BID) | ORAL | 0 refills | Status: AC

## 2018-03-04 MED ORDER — SENNOSIDES-DOCUSATE SODIUM 8.6-50 MG PO TABS: 2.0000 | ORAL_TABLET | Freq: Every day | ORAL | 1 refills | Status: DC

## 2018-03-04 MED ORDER — FERROUS SULFATE 325 (65 FE) MG PO TABS: 325.0000 mg | ORAL_TABLET | Freq: Two times a day (BID) | ORAL | 1 refills | Status: AC

## 2018-03-04 NOTE — Discharge Summary (Addendum)
Hannah Sanders, is a 42 y.o. female  DOB 06-May-1976  MRN [id number].  Admission date:  02/24/2018  Admitting Physician  Norval Morton, MD  Discharge Date:  03/04/2018   Primary MD  Patient, No Pcp Per  Recommendations for primary care physician for things to follow:   1)You need to have repeat complete blood count/CBC with your primary care doctor within 1 week 2)Avoid ibuprofen/Advil/Aleve/Motrin/Goody Powders/Naproxen/BC powders/Meloxicam/Diclofenac/Indomethacin and other Nonsteroidal anti-inflammatory medications as these will make you more likely to bleed and can cause stomach ulcers-----you have anemia (low blood count) 3)Recheck your buttock wounds with your primary care doctor within 1 week, you may need to follow-up with Dr. Marla Roe the plastic surgeon  Admission Diagnosis  need blood transfusion   Discharge Diagnosis  need blood transfusion    Active Problems:   Sepsis ([place])   Anemia   Cellulitis and abscess of buttock   ARF (acute renal failure) ([place])   Postoperative fever      Past Medical History:  Diagnosis Date  . Allergy   . Depression   . Hepatitis   . Hypertension   . Liver disease    Diagnosed With Hep C    Past Surgical History:  Procedure Laterality Date  . ABDOMINAL SURGERY  05/2016   ABDOMINOPLASTY   . BREAST SURGERY  05/2016   BREAST REDUCTION  . CHOLECYSTECTOMY, LAPAROSCOPIC         HPI  from the history and physical done on the day of admission:    Complaint: Fever and low hemoglobin  HPI: Hannah Sanders is a 42 y.o. female with medical history significant of hypertension, history of hep C, depression, breast enlargement, obstructive sleep apnea had Brazilian butt lift done on 02/15/2018 at [facility].  Patient was given a prescription for Keflex on the same day of the procedure as a prophylaxis.  Patient started having fever 02/18/2018.   She continued taking Keflex at home.  She reported that she had liposuction done so fat was taken out of her belly and was put on both of her buttocks.  Does complain of increased pain in the buttocks area.  She denies any nausea vomiting diarrhea, urinary complaints, cough.  She does complain of some shortness of breath which is new.  She denies any pleuritic chest pain.  She denies having had any drains placed or any dressings on her incisions in her butt or stomach.  However she reported that both the buttocks were hard which was expected and she was getting daily messages on that area.  And she reported that a lot of blood came out from the sites during the massage.  Her last menstrual period was January 25, 2018.  She denies heavy menstrual cycles.  She denies abdominal pain hematochezia melena.  She went to the urgent care first and from urgent care she was sent to [facility] long ER.  At the urgent care her hemoglobin was 8.2 which is down from 13.610 days ago prior to her procedure in [place].  She denies having a history of anemia.  She denies excessive menstrual bleeding.  She denies blood in her stool.  However she was found to be FOBT positive here.  She has no family history of any kind of anemia.  She denies any constipation.   ED Course: Patient was given Vanco and Zosyn.  A CT scan of the abdomen and pelvis was done in the emergency room which showedNo acute intra-abdominal or pelvic pathology. 2. Inflammatory changes and stranding of the subcutaneous soft tissues of the abdomen and pelvis with areas of fat necrosis related to recent procedure. No drainable fluid collection or abscess.  Her lactic acid level was 1.67 hemoglobin was 8.2 hematocrit 24.8, pregnancy test was negative, fecal occult blood test was positive.  Influenza a and B was negative.  UA was negative.  Urine culture and blood culture was done.    Hospital Course:    Brief Narrative:   Hannah Mckinny Muldrowis a 42  y.o.femalewith medical history significant ofhypertension, history of hep C, depression, breast enlargement, obstructive sleep apnea had Brazilian butt lift done on 02/15/2018 at [facility]. Patient was given a prescription for Keflex on the same day of the procedure as a prophylaxis.  He presents with worsening pain and fever and symptomatic anemia.    Assessment & Plan:   Active Problems:   Sepsis ([place])   1)Sepsis secondary to buttock cellulitis--patient presented with infected buttocks wound secondary to recent BUTT Lift surgery in [place] on admission patient met sepsis criteria with fevers, tachycardia, hypotension and leukocytosis, lactic acidosis was not elevated, completed 7 days of IV vancomycin and Zosyn, blood cultures negative, plastic surgery consult from Dr Iran Planas appreciated, CT abdomen and pelvis without frank abscess.  Patient became afebrile and leukocytosis resolved, okay to discharge home on p.o. doxycycline and Keflex  2)Symptomatic anemia/acute anemia of blood loss post butt lift surgery------ suspect acute blood loss anemia, patient baseline hemoglobin typically around 13, patient did receive 2 units of packed cells, globin currently stable above 8,.  He was concerned about possible guaiac positive stools, patient is advised to avoid NSAIDs and follow-up with GI as outpatient.  Repeat CBC with PCP within a week    3)HAs-usually resolved, CT head and MRI brain without acute findings   4)HTN-stable, continue HCTZ   Discharge Condition: stable  Follow UP- PCP and plastic surgery   Consults obtained - Plastic surgeon  Diet and Activity recommendation:  As advised  Discharge Instructions    Discharge Instructions    Call MD for:  difficulty breathing, headache or visual disturbances   Complete by:  As directed    Call MD for:  persistant dizziness or light-headedness   Complete by:  As directed    Call MD for:  persistant nausea and  vomiting   Complete by:  As directed    Call MD for:  redness, tenderness, or signs of infection (pain, swelling, redness, odor or green/yellow discharge around incision site)   Complete by:  As directed    Call MD for:  severe uncontrolled pain   Complete by:  As directed    Call MD for:  temperature >100.4   Complete by:  As directed    Diet - low sodium heart healthy   Complete by:  As directed    Discharge instructions   Complete by:  As directed    1)You need to have repeat complete blood count/CBC with your primary care doctor within 1 week 2)Avoid ibuprofen/Advil/Aleve/Motrin/Goody Powders/Naproxen/BC  powders/Meloxicam/Diclofenac/Indomethacin and other Nonsteroidal anti-inflammatory medications as these will make you more likely to bleed and can cause stomach ulcers-----you have anemia (low blood count) 3) recheck your buttock wounds with your primary care doctor within 1 week, you may need to follow-up with Dr. Marla Roe the plastic surgeon   Increase activity slowly   Complete by:  As directed        Discharge Medications     Allergies as of 03/04/2018      Reactions   Shellfish-derived Products Swelling      Medication List    STOP taking these medications   HYDROcodone-acetaminophen 10-325 MG tablet Commonly known as:  NORCO     TAKE these medications   acetaminophen 325 MG tablet Commonly known as:  TYLENOL Take 2 tablets (650 mg total) by mouth every 6 (six) hours as needed for mild pain or headache.   cephALEXin 500 MG capsule Commonly known as:  KEFLEX Take 1 capsule (500 mg total) by mouth 3 (three) times daily. What changed:  when to take this   doxycycline 100 MG tablet Commonly known as:  VIBRA-TABS Take 1 tablet (100 mg total) by mouth 2 (two) times daily for 7 days.   ferrous sulfate 325 (65 FE) MG tablet Take 1 tablet (325 mg total) by mouth 2 (two) times daily with a meal.   hydrochlorothiazide 25 MG tablet Commonly known as:   HYDRODIURIL Take 25 mg by mouth daily.   senna-docusate 8.6-50 MG tablet Commonly known as:  Senokot-S Take 2 tablets by mouth at bedtime.      Major procedures and Radiology Reports - PLEASE review detailed and final reports for all details, in brief -   Dg Chest 2 View  Result Date: 02/24/2018 CLINICAL DATA:  Fever for 3 days, status post liposuction. EXAM: CHEST - 2 VIEW COMPARISON:  Chest radiograph February 27, 2016 FINDINGS: Cardiomediastinal silhouette is normal. No pleural effusions or focal consolidations. Trachea projects midline and there is no pneumothorax. Soft tissue planes and included osseous structures are non-suspicious. Surgical clips in the included right abdomen compatible with cholecystectomy. IMPRESSION: Negative. Electronically Signed   By: Elon Alas M.D.   On: 02/24/2018 22:12   Ct Head Wo Contrast  Result Date: 02/27/2018 CLINICAL DATA:  Headache, acute, severe, worst of life. Persistent headaches since surgery EXAM: CT HEAD WITHOUT CONTRAST TECHNIQUE: Contiguous axial images were obtained from the base of the skull through the vertex without intravenous contrast. COMPARISON:  None. FINDINGS: Brain: No evidence of acute infarction, hemorrhage, hydrocephalus, extra-axial collection or mass lesion/mass effect. Vascular: No hyperdense vessel or unexpected calcification. Skull: Normal. Negative for fracture or focal lesion. Sinuses/Orbits: Negative IMPRESSION: Negative head CT.  No explanation for headache. Electronically Signed   By: Monte Fantasia M.D.   On: 02/27/2018 11:56   Mr Brain Wo Contrast  Result Date: 03/03/2018 CLINICAL DATA:  41 y/o  F; worst headache of life. EXAM: MRI HEAD WITHOUT CONTRAST TECHNIQUE: Multiplanar, multiecho pulse sequences of the brain and surrounding structures were obtained without intravenous contrast. COMPARISON:  02/27/2018 CT head FINDINGS: Brain: No acute infarction, hemorrhage, hydrocephalus, extra-axial collection or mass  lesion. No gross structural abnormality of the brain. No white matter lesion. Vascular: Normal flow voids. Skull and upper cervical spine: Normal marrow signal. Sinuses/Orbits: Negative. Other: None. IMPRESSION: Normal MRI of the brain. Electronically Signed   By: Kristine Garbe M.D.   On: 03/03/2018 17:51   Ct Abdomen Pelvis W Contrast  Result Date: 02/25/2018 CLINICAL DATA:  42 year old female with abdominal pain. Body aches and fever. Status post [place] and liposuction. EXAM: CT ABDOMEN AND PELVIS WITH CONTRAST TECHNIQUE: Multidetector CT imaging of the abdomen and pelvis was performed using the standard protocol following bolus administration of intravenous contrast. CONTRAST:  128m ISOVUE-300 IOPAMIDOL (ISOVUE-300) INJECTION 61% COMPARISON:  None. FINDINGS: Lower chest: The visualized lung bases are clear. No intra-abdominal free air or free fluid. Hepatobiliary: There is apparent mild fatty infiltration of the liver. No intrahepatic biliary ductal dilatation. Cholecystectomy. Pancreas: Unremarkable. No pancreatic ductal dilatation or surrounding inflammatory changes. Spleen: Normal in size without focal abnormality. Adrenals/Urinary Tract: Adrenal glands are unremarkable. Kidneys are normal, without renal calculi, focal lesion, or hydronephrosis. Bladder is unremarkable. Stomach/Bowel: Stomach is within normal limits. Appendix appears normal. No evidence of bowel wall thickening, distention, or inflammatory changes. Vascular/Lymphatic: No significant vascular findings are present. No enlarged abdominal or pelvic lymph nodes. Reproductive: The uterus is anteverted. An intrauterine device is noted. The ovaries are grossly unremarkable as visualized. No pelvic mass. Other: There is diffuse stranding and edema of the subcutaneous soft tissues of the abdomen related to recent procedure. Scattered areas of subcutaneous fat necrosis as well as pockets of subcutaneous soft tissue air noted.  There is no drainable fluid collection or abscess. Musculoskeletal: Degenerative changes primarily at L3-L4 with endplate irregularity and disc desiccation and vacuum phenomena. No acute osseous pathology. IMPRESSION: 1. No acute intra-abdominal or pelvic pathology. 2. Inflammatory changes and stranding of the subcutaneous soft tissues of the abdomen and pelvis with areas of fat necrosis related to recent procedure. No drainable fluid collection or abscess. Electronically Signed   By: AAnner CreteM.D.   On: 02/25/2018 03:00   UK[place]Lt Lower Extrem Ltd Soft Tissue Non Vascular  Result Date: 03/02/2018 CLINICAL DATA:  Status post plastic surgery. Abnormal CT scan with abscess. EXAM: ULTRASOUND left buttocks, LOWER EXTREMITY LIMITED TECHNIQUE: Ultrasound examination of the lower extremity soft tissues was performed in the area of clinical concern. COMPARISON:  None. FINDINGS: Skin thickening is evident as on CT. 2 hypoechoic fluid collections are present. These measure 1.7 x 1.4 x 2.2 cm and 3.8 x 1.5 x 3.1 cm respectively. IMPRESSION: 1. Two hypoechoic collections with increased echogenicity surrounding concerning for abscess. Measurements are as above. Electronically Signed   By: CSan MorelleM.D.   On: 03/02/2018 14:42    Micro Results   Recent Results (from the past 240 hour(s))  Urine culture     Status: Abnormal   Collection Time: 02/24/18  9:15 PM  Result Value Ref Range Status   Specimen Description   Final    URINE, CATHETERIZED Performed at [facility] [place] [address]   Special Requests   Final    NONE Performed at [facility] [address], [place] [address]   Culture (A)  Final    <10,000 COLONIES/mL INSIGNIFICANT GROWTH Performed at [address], [place] [address]   Report Status 02/26/2018 FINAL  Final  Culture, blood (routine x 2)     Status: None    Collection Time: 02/24/18  9:30 PM  Result Value Ref Range Status   Specimen Description   Final    BLOOD LEFT FOREARM Performed at [address] [place] [address]   Special Requests   Final    BOTTLES DRAWN AEROBIC AND ANAEROBIC Blood Culture adequate  volume Performed at [facility], [place] [address], [place], [place] [address]    Culture   Final    NO GROWTH 5 DAYS Performed at [facility], [place] [address], [place], [place] [address]    Report Status 03/01/2018 FINAL  Final  Culture, blood (routine x 2)     Status: None   Collection Time: 02/24/18  9:30 PM  Result Value Ref Range Status   Specimen Description   Final    BLOOD LEFT ARM Performed at [place] [address] [place], [place] [address]    Special Requests   Final    BOTTLES DRAWN AEROBIC AND ANAEROBIC Blood Culture adequate volume Performed at [place] [address], [place], [place] [address]    Culture   Final    NO GROWTH 5 DAYS Performed at [facility], [place] [address] [place], [place] [address]    Report Status 03/01/2018 FINAL  Final       Today   Subjective    Hannah Sanders today has no new complaints, no fever no chills no nausea no vomiting no diarrhea          Patient has been seen and examined prior to discharge   Objective   Blood pressure 120/78, pulse 63, temperature 97.8 F (36.6 C), temperature source Oral, resp. rate 18, height '5\' 4"'  (1.626 m), weight 95.2 kg (209 lb 14.1 oz), last menstrual period 01/25/2018, SpO2 95 %.   Intake/Output Summary (Last 24 hours) at 03/04/2018 1633 Last data filed at 03/04/2018 1505 Gross per 24 hour  Intake 390 ml  Output -  Net 390 ml    Exam Gen:- Awake Alert,  In no apparent distress  HEENT:- [place].AT, No sclera icterus Neck-Supple Neck,No JVD,.  Lungs-  CTAB , good air movement CV- S1, S2 normal Abd-  +ve B.Sounds, Abd  Soft, No tenderness,    Extremity/Skin:- No  edema,   good pulses Psych-affect is appropriate, oriented x3 Neuro-no new focal deficits, no tremors Buttocks-exam with RN Shanon Brow present at bedside as chaperone, postop incisions appear clean dry and intact, right buttock with area of induration/warmth discoloration/bruising and slight tenderness, no significant erythema, no streaking, no fluctuance, the area of discoloration appears to be smaller than the previously marked area   Data Review   CBC w Diff:  Lab Results  Component Value Date   WBC 8.2 03/03/2018   HGB 8.8 (L) 03/03/2018   HCT 27.8 (L) 03/03/2018   PLT 508 (H) 03/03/2018   LYMPHOPCT 19 03/03/2018   MONOPCT 12 03/03/2018   EOSPCT 3 03/03/2018   BASOPCT 1 03/03/2018    CMP:  Lab Results  Component Value Date   NA 141 03/03/2018   K 3.7 03/03/2018   CL 104 03/03/2018   CO2 29 03/03/2018   BUN 9 03/03/2018   CREATININE 1.01 (H) 03/04/2018   CREATININE 0.95 02/03/2015   PROT 6.9 02/25/2018   ALBUMIN 2.8 (L) 02/25/2018   BILITOT 1.2 02/25/2018   ALKPHOS 130 (H) 02/25/2018   AST 30 02/25/2018   ALT 53 02/25/2018  .   Total Discharge time is about 33 minutes  Roxan Hockey M.D on 03/04/2018 at 4:33 PM  [facility]   Office  [phone]  Voice Recognition Viviann Spare dictation system was used to create this note, attempts have been made to correct errors. Please contact the author with questions and/or clarifications.

## 2018-03-04 NOTE — Discharge Instructions (Signed)
1)You need to have repeat complete blood count/CBC with your primary care doctor within 1 week 2)Avoid ibuprofen/Advil/Aleve/Motrin/Goody Powders/Naproxen/BC powders/Meloxicam/Diclofenac/Indomethacin and other Nonsteroidal anti-inflammatory medications as these will make you more likely to bleed and can cause stomach ulcers-----you have anemia (low blood count) 3) recheck your buttock wounds with your primary care doctor within 1 week, you may need to follow-up with Dr. Ulice Boldillingham the plastic surgeon

## 2018-03-04 NOTE — Progress Notes (Signed)
Patient given discharge instructions, and verbalized an understanding of all discharge instructions.  Patient agrees with discharge plan, and is being discharged in stable medical condition.  Patient given transportation via wheelchair. 

## 2018-03-04 NOTE — Progress Notes (Signed)
  Plastic Surgery  HD#9  No fevers, feeling better. MRI head negative  Temp:  [97.8 F (36.6 C)-98 F (36.7 C)] 97.8 F (36.6 C) (04/04 0617) Pulse Rate:  [63-73] 63 (04/04 0617) Resp:  [18] 18 (04/04 0617) BP: (120-135)/(74-85) 120/78 (04/04 0617) SpO2:  [94 %-98 %] 95 % (04/04 0617)   PE: Abdomen benign Buttock - bilateral firm consistent with recent surgery, area of concern is lighter in color decreased in size  A/P Continued improved appearance right buttock. Clinically remains afebrile,  WBC normal. Anemia stable post transfusion. Feel patient stable for discharge on oral antibiotics.  Patient has seen Dr. Ulice Boldillingham in consultation this year and plans to have surgery with her for revision breast reduction. Patient may f/u our office as needed. Will sign off, call if additional concerns.    Glenna FellowsBrinda Loribeth Katich, MD [facility] Plastic & Reconstructive Surgery [phone], pin [phone]

## 2018-05-14 ENCOUNTER — Encounter: Payer: Self-pay | Admitting: Obstetrics & Gynecology

## 2018-05-14 ENCOUNTER — Ambulatory Visit: Payer: 59 | Admitting: Obstetrics & Gynecology

## 2018-05-14 VITALS — BP 130/86

## 2018-05-14 DIAGNOSIS — Z30431 Encounter for routine checking of intrauterine contraceptive device: Secondary | ICD-10-CM

## 2018-05-14 DIAGNOSIS — Z113 Encounter for screening for infections with a predominantly sexual mode of transmission: Secondary | ICD-10-CM

## 2018-05-14 DIAGNOSIS — Z1151 Encounter for screening for human papillomavirus (HPV): Secondary | ICD-10-CM | POA: Diagnosis not present

## 2018-05-14 DIAGNOSIS — N870 Mild cervical dysplasia: Secondary | ICD-10-CM | POA: Diagnosis not present

## 2018-05-14 DIAGNOSIS — Z124 Encounter for screening for malignant neoplasm of cervix: Secondary | ICD-10-CM | POA: Diagnosis not present

## 2018-05-14 DIAGNOSIS — N898 Other specified noninflammatory disorders of vagina: Secondary | ICD-10-CM

## 2018-05-14 LAB — WET PREP FOR TRICH, YEAST, CLUE: RESULT: 8

## 2018-05-14 MED ORDER — TINIDAZOLE 500 MG PO TABS: 2.0000 g | ORAL_TABLET | Freq: Every day | ORAL | 2 refills | Status: AC

## 2018-05-14 MED ORDER — FLUCONAZOLE 150 MG PO TABS: 150.0000 mg | ORAL_TABLET | Freq: Once | ORAL | 2 refills | Status: AC

## 2018-05-14 NOTE — Patient Instructions (Signed)
1. Vaginal discharge Bacterial vaginosis clinically and confirmed by wet prep.  Diagnosis and treatment reviewed with patient.  Will treat with tinidazole.  Usage reviewed and prescription sent to pharmacy.  Fluconazole x1 tablet prescribed to treat or prevent yeast after the antibiotics.  Given the fact that patient has had frequent recurrences of either bacterial vaginosis or yeast vaginitis, decision to retreat in 2 weeks.  Recommend using probiotic tablets or suppositories vaginally to restore a good vaginal flora after treatment.  Can use the probiotic vaginally daily at first and then progressively space it.  Precautions discussed with patient to help prevent recurrences.  - WET PREP FOR TRICH, YEAST, CLUE  2. 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  Repeat Pap test done today.  [facility] patient was seen over there since last July 2018.  3. Screening for malignant neoplasm of cervix  - Pap IG, CT/NG NAA, and HPV (high risk)  4. Special screening examination for human papillomavirus (HPV) - Pap IG, CT/NG NAA, and HPV (high risk)  5. Screening examination for venereal disease - Pap IG, CT/NG NAA, and HPV (high risk)  6. Encounter for routine checking of intrauterine contraceptive device (IUD) Mirena IUD well tolerated and in good position.  Other orders - tinidazole (TINDAMAX) 500 MG tablet; Take 4 tablets (2,000 mg total) by mouth daily for 2 days. - fluconazole (DIFLUCAN) 150 MG tablet; Take 1 tablet (150 mg total) by mouth once for 1 dose.  Hannah Sanders, good seeing you today!  I will inform you of your results as soon as they are available.

## 2018-05-14 NOTE — Progress Notes (Signed)
    [facility] 04/09/1976 [id number]        42 y.o.  [id number]  Stable boyfriend.  Vasectomy  RP: Recurrent vaginal discharge   HPI: Has had many episodes of Yeast vaginitis and Bacterial vaginitis.  Currently vaginal discharge with odor.  H/O CIN 1 Colpo 01/2016.  Pap 01/2017 LGSIL/HR HPV pos.  H/O Genital HSV.  Well on Mirena IUD x 05/2017.   OB History  Gravida Para Term Preterm AB Living  4 3     1 3   SAB TAB Ectopic Multiple Live Births               # Outcome Date GA Lbr Len/2nd Weight Sex Delivery Anes PTL Lv  4 AB           3 Para           2 Para           1 Para             Past medical history,surgical history, problem list, medications, allergies, family history and social history were all reviewed and documented in the EPIC chart.   Directed ROS with pertinent positives and negatives documented in the history of present illness/assessment and plan.  Exam:  Vitals:   05/14/18 1002  BP: 130/86   General appearance:  Normal   Gynecologic exam: Vulva normal.  Speculum:  Cervix normal.  IUD strings seen.  Increased vaginal discharge with odor.  Wet prep done.  Pap/HR HPV, Gono-Chlam done.  Wet prep: Clue cells present   Assessment/Plan:  42 y.o. [id number]   1. Vaginal discharge Bacterial vaginosis clinically and confirmed by wet prep.  Diagnosis and treatment reviewed with patient.  Will treat with tinidazole.  Usage reviewed and prescription sent to pharmacy.  Fluconazole x1 tablet prescribed to treat or prevent yeast after the antibiotics.  Given the fact that patient has had frequent recurrences of either bacterial vaginosis or yeast vaginitis, decision to retreat in 2 weeks.  Recommend using probiotic tablets or suppositories vaginally to restore a good vaginal flora after treatment.  Can use the probiotic vaginally daily at first and then progressively space it.  Precautions discussed with patient to help prevent recurrences.  - WET PREP FOR TRICH,  YEAST, CLUE  2. 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  Repeat Pap test done today.  [facility] as patient was seen over there since last July 2018.  3. Screening for malignant neoplasm of cervix  - Pap IG, CT/NG NAA, and HPV (high risk)  4. Special screening examination for human papillomavirus (HPV) - Pap IG, CT/NG NAA, and HPV (high risk)  5. Screening examination for venereal disease - Pap IG, CT/NG NAA, and HPV (high risk)  6. Encounter for routine checking of intrauterine contraceptive device (IUD) Mirena IUD well tolerated and in good position.  Other orders - tinidazole (TINDAMAX) 500 MG tablet; Take 4 tablets (2,000 mg total) by mouth daily for 2 days. - fluconazole (DIFLUCAN) 150 MG tablet; Take 1 tablet (150 mg total) by mouth once for 1 dose.  Counseling on above issues and coordination of care more than 50% for 25 minutes.  Genia DelMarie-Lyne Grier Vu MD, 10:15 AM 05/14/2018

## 2018-05-17 ENCOUNTER — Telehealth: Payer: Self-pay | Admitting: *Deleted

## 2018-05-17 MED ORDER — FLUCONAZOLE 150 MG PO TABS: 150.0000 mg | ORAL_TABLET | Freq: Once | ORAL | 2 refills | Status: DC

## 2018-05-17 MED ORDER — TINIDAZOLE 500 MG PO TABS: ORAL_TABLET | ORAL | 2 refills | Status: AC

## 2018-05-17 NOTE — Telephone Encounter (Signed)
Patient called upset because her Rx was not sent to correct pharmacy from [facility] on 05/14/18. I will send tindamax 500 mg tablet #4 2 refills  take 4 tablets by mouth daily and diflucan 150mg  1 tablet #2 refills. Both Rx was sent to wrong pharmacy. Correct pharmacy placed , Rx sent.

## 2018-05-20 LAB — PAP IG, CT-NG NAA, HPV HIGH-RISK
C. TRACHOMATIS RNA, TMA: NOT DETECTED
HPV DNA High Risk: DETECTED — AB
N. gonorrhoeae RNA, TMA: NOT DETECTED

## 2018-05-20 LAB — HPV TYPE 16 AND 18/45 RNA
HPV Type 16 RNA: NOT DETECTED
HPV Type 18/45 RNA: NOT DETECTED

## 2018-05-31 ENCOUNTER — Other Ambulatory Visit: Payer: Self-pay | Admitting: Obstetrics & Gynecology

## 2018-05-31 MED ORDER — METRONIDAZOLE 0.75 % VA GEL: VAGINAL | 0 refills | Status: AC

## 2018-06-28 ENCOUNTER — Ambulatory Visit: Payer: 59 | Admitting: Obstetrics & Gynecology

## 2018-06-28 DIAGNOSIS — Z0289 Encounter for other administrative examinations: Secondary | ICD-10-CM

## 2018-09-01 ENCOUNTER — Other Ambulatory Visit: Payer: Self-pay | Admitting: Obstetrics & Gynecology

## 2018-09-03 ENCOUNTER — Encounter: Payer: 59 | Admitting: Obstetrics & Gynecology

## 2018-11-18 ENCOUNTER — Encounter: Payer: 59 | Admitting: Obstetrics & Gynecology

## 2019-02-03 ENCOUNTER — Encounter: Payer: 59 | Admitting: Obstetrics & Gynecology

## 2019-03-07 ENCOUNTER — Other Ambulatory Visit: Payer: Self-pay | Admitting: Obstetrics and Gynecology

## 2019-03-07 DIAGNOSIS — Z1231 Encounter for screening mammogram for malignant neoplasm of breast: Secondary | ICD-10-CM

## 2019-05-03 ENCOUNTER — Other Ambulatory Visit: Payer: Self-pay

## 2019-05-03 ENCOUNTER — Ambulatory Visit
Admission: RE | Admit: 2019-05-03 | Discharge: 2019-05-03 | Disposition: A | Discharge status: Home / Self Care | Payer: 59 | Source: Ambulatory Visit | Attending: Obstetrics and Gynecology | Admitting: Obstetrics and Gynecology

## 2019-05-03 DIAGNOSIS — Z1231 Encounter for screening mammogram for malignant neoplasm of breast: Secondary | ICD-10-CM

## 2019-12-09 ENCOUNTER — Emergency Department (HOSPITAL_COMMUNITY)
Admission: EM | Admit: 2019-12-09 | Discharge: 2019-12-09 | Disposition: A | Discharge status: Home / Self Care | Payer: 59 | Attending: Emergency Medicine | Admitting: Emergency Medicine

## 2019-12-09 ENCOUNTER — Other Ambulatory Visit: Payer: Self-pay

## 2019-12-09 ENCOUNTER — Encounter (HOSPITAL_COMMUNITY): Payer: Self-pay | Admitting: Emergency Medicine

## 2019-12-09 ENCOUNTER — Emergency Department (HOSPITAL_COMMUNITY): Payer: 59

## 2019-12-09 DIAGNOSIS — R071 Chest pain on breathing: Secondary | ICD-10-CM | POA: Diagnosis not present

## 2019-12-09 DIAGNOSIS — Z79899 Other long term (current) drug therapy: Secondary | ICD-10-CM | POA: Diagnosis not present

## 2019-12-09 DIAGNOSIS — R438 Other disturbances of smell and taste: Secondary | ICD-10-CM | POA: Diagnosis not present

## 2019-12-09 DIAGNOSIS — R509 Fever, unspecified: Secondary | ICD-10-CM | POA: Diagnosis not present

## 2019-12-09 DIAGNOSIS — U071 COVID-19: Secondary | ICD-10-CM | POA: Insufficient documentation

## 2019-12-09 DIAGNOSIS — I10 Essential (primary) hypertension: Secondary | ICD-10-CM | POA: Insufficient documentation

## 2019-12-09 DIAGNOSIS — M7918 Myalgia, other site: Secondary | ICD-10-CM | POA: Insufficient documentation

## 2019-12-09 DIAGNOSIS — R0600 Dyspnea, unspecified: Secondary | ICD-10-CM | POA: Diagnosis present

## 2019-12-09 MED ORDER — ALBUTEROL SULFATE HFA 108 (90 BASE) MCG/ACT IN AERS
2.0000 | INHALATION_SPRAY | Freq: Once | RESPIRATORY_TRACT | Status: AC
  Administered 2019-12-09: 2 via RESPIRATORY_TRACT
  Filled 2019-12-09: qty 6.7

## 2019-12-09 MED ORDER — AEROCHAMBER PLUS FLO-VU MEDIUM MISC
1.0000 | Freq: Once | Status: AC
  Administered 2019-12-09: 1
  Filled 2019-12-09: qty 1

## 2019-12-09 MED ORDER — ONDANSETRON 4 MG PO TBDP: 4.0000 mg | ORAL_TABLET | Freq: Three times a day (TID) | ORAL | 0 refills | Status: AC | PRN

## 2019-12-09 NOTE — ED Provider Notes (Signed)
[facility] DEPT Provider Note   CSN: [id number] Arrival date & time: 12/09/19  1331     History Chief Complaint  Patient presents with  . Covid+  . Shortness of Breath    Hannah Sanders is a 44 y.o. female with a hx of hypertension, obstructive sleep apnea, anemia, & depression with covid positive testing 12/03/19 with complaints of worsening dyspnea. Patient states she has had loss of taste/smell, fever, chills, nausea, dyspnea, tightness with breathing, and generalized body aches.  No significant alleviating or aggravating factors.  Dyspnea was initially just with activity now seems to be constant prompting ED visit, she wanted to make sure her SpO2 was okay.  Denies lightheadedness, dizziness, syncope, vomiting, unilateral leg pain/swelling, hemoptysis, recent surgery/trauma, recent long travel, hormone use, personal hx of cancer, or hx of DVT/PE.  Denies irregular bleeding. Denies chance of pregnancy.   HPI     Past Medical History:  Diagnosis Date  . Allergy   . Depression   . Hepatitis   . Hypertension   . Liver disease    Diagnosed With Hep C    Patient Active Problem List   Diagnosis Date Noted  . Anemia 03/03/2018  . Cellulitis and abscess of buttock 03/03/2018  . ARF (acute renal failure) ([place]) 03/03/2018  . Postoperative fever   . Sepsis ([place]) 02/25/2018  . Breast enlargement 03/11/2016  . OSA (obstructive sleep apnea) 02/02/2015  . Severe obesity (BMI >= 40) ([place]) 02/02/2015    Past Surgical History:  Procedure Laterality Date  . ABDOMINAL SURGERY  05/2016   ABDOMINOPLASTY   . BREAST SURGERY  05/2016   BREAST REDUCTION  . CHOLECYSTECTOMY, LAPAROSCOPIC       OB History    Gravida  4   Para  3   Term      Preterm      AB  1   Living  3     SAB      TAB      Ectopic      Multiple      Live Births              Family History  Problem Relation Age of Onset  . Heart disease Mother   .  Hypertension Mother   . Diabetes Mother   . Heart disease Father   . Hypertension Father   . Diabetes Father   . Heart disease Brother   . Diabetes Daughter   . Heart disease Maternal Grandmother   . Hypertension Maternal Grandmother   . Diabetes Maternal Grandfather   . Diabetes Paternal Grandmother   . Diabetes Maternal Aunt   . Diabetes Maternal Uncle   . Diabetes Paternal Aunt   . Diabetes Paternal Grandfather     Social History   Tobacco Use  . Smoking status: Never Smoker  . Smokeless tobacco: Never Used  Substance Use Topics  . Alcohol use: Yes    Comment: SOCIAL  . Drug use: No    Home Medications Prior to Admission medications   Medication Sig Start Date End Date Taking? Authorizing Provider  ferrous sulfate 325 (65 FE) MG tablet Take 1 tablet (325 mg total) by mouth 2 (two) times daily with a meal. 03/04/18   Emokpae, Courage, MD  fluconazole (DIFLUCAN) 150 MG tablet TAKE 1 TABLET(150 MG) BY MOUTH 1 TIME FOR 1 DOSE 09/02/18   Princess Bruins, MD  hydrochlorothiazide (HYDRODIURIL) 25 MG tablet Take 25 mg by mouth daily.  [provider]  metroNIDAZOLE (METROGEL) 0.75 % vaginal gel Insert appful vaginally hs x 5 nights. 05/31/18   Genia Del, MD  tinidazole (TINDAMAX) 500 MG tablet Take 4 tablet by mouth daily for 2 days. 05/17/18   Genia Del, MD    Allergies    Shellfish-derived products  Review of Systems   Review of Systems  Constitutional: Positive for chills, fatigue and fever.  HENT: Negative for congestion, ear pain and sore throat.   Respiratory: Positive for cough, chest tightness and shortness of breath.   Cardiovascular: Negative for leg swelling.  Gastrointestinal: Positive for nausea. Negative for abdominal pain, blood in stool and vomiting.  Genitourinary: Negative for dysuria.  Neurological: Negative for dizziness, syncope and light-headedness.    Physical Exam Updated Vital Signs BP (!) 140/108   Pulse 76   Temp  98.9 F (37.2 C) (Oral)   Resp 19   LMP 11/18/2019   SpO2 97%   Physical Exam Vitals and nursing note reviewed.  Constitutional:      General: She is not in acute distress.    Appearance: She is well-developed.  HENT:     Head: Normocephalic and atraumatic.     Right Ear: Ear canal normal. Tympanic membrane is not perforated, erythematous, retracted or bulging.     Left Ear: Ear canal normal. Tympanic membrane is not perforated, erythematous, retracted or bulging.     Ears:     Comments: No mastoid erythema/swelling/tenderness.     Nose:     Right Sinus: No maxillary sinus tenderness or frontal sinus tenderness.     Left Sinus: No maxillary sinus tenderness or frontal sinus tenderness.     Mouth/Throat:     Pharynx: Uvula midline. No oropharyngeal exudate or posterior oropharyngeal erythema.     Comments: Posterior oropharynx is symmetric appearing. Patient tolerating own secretions without difficulty. No trismus. No drooling. No hot potato voice. No swelling beneath the tongue, submandibular compartment is soft.  Eyes:     General:        Right eye: No discharge.        Left eye: No discharge.     Conjunctiva/sclera: Conjunctivae normal.     Pupils: Pupils are equal, round, and reactive to light.     Comments: No conjunctival pallor.  Cardiovascular:     Rate and Rhythm: Normal rate and regular rhythm.     Heart sounds: No murmur.  Pulmonary:     Effort: Pulmonary effort is normal. No respiratory distress.     Breath sounds: Normal breath sounds. No wheezing, rhonchi or rales.     Comments: I personally ambulated patient throughout the exam room, maintained her SPO2 at  98-100% on room air. Abdominal:     General: There is no distension.     Palpations: Abdomen is soft.     Tenderness: There is no abdominal tenderness.  Musculoskeletal:     Cervical back: Normal range of motion and neck supple. No edema or rigidity.     Right lower leg: No tenderness. No edema.     Left  lower leg: No tenderness. No edema.  Lymphadenopathy:     Cervical: No cervical adenopathy.  Skin:    General: Skin is warm and dry.     Findings: No rash.  Neurological:     Mental Status: She is alert.  Psychiatric:        Behavior: Behavior normal.     ED Results / Procedures / Treatments   Labs (all  labs ordered are listed, but only abnormal results are displayed) Labs Reviewed - No data to display  EKG None  Radiology DG Chest [facility] 1 View  Result Date: 12/09/2019 CLINICAL DATA:  COVID-19 positive.  Cough. EXAM: PORTABLE CHEST 1 VIEW COMPARISON:  February 24, 2018 FINDINGS: There is bibasilar atelectasis. Lungs elsewhere are clear. Heart size and pulmonary vascularity are normal. No adenopathy. No bone lesions. IMPRESSION: Bibasilar atelectasis. Lungs elsewhere clear. Cardiac silhouette normal. No adenopathy. Electronically Signed   By: Bretta Bang III M.D.   On: 12/09/2019 15:14    Procedures Procedures (including critical care time)  Medications Ordered in ED Medications  albuterol (VENTOLIN HFA) 108 (90 Base) MCG/ACT inhaler 2 puff (2 puffs Inhalation Given 12/09/19 1527)  AeroChamber Plus Flo-Vu Medium MISC 1 each (1 each Other Given 12/09/19 1527)    ED Course  I have reviewed the triage vital signs and the nursing notes.  Pertinent labs & imaging results that were available during my care of the patient were reviewed by me and considered in my medical decision making (see chart for details).  Sheldon Silvan was evaluated in Emergency Department on 12/09/2019 for the symptoms described in the history of present illness. He/she was evaluated in the context of the global COVID-19 pandemic, which necessitated consideration that the patient might be at risk for infection with the SARS-CoV-2 virus that causes COVID-19. Institutional protocols and algorithms that pertain to the evaluation of patients at risk for COVID-19 are in a state of rapid change based on  information released by regulatory bodies including the CDC and federal and state organizations. These policies and algorithms were followed during the patient's care in the ED.    MDM Rules/Calculators/A&P                      Patient with known Covid +19 testing presents to the emergency department with worsening dyspnea.  Patient nontoxic-appearing, resting comfortably, vitals WNL with exception of elevated blood pressure, doubt HTN emergency.  Lungs are clear to auscultation bilaterally, no significant wheezing, chest x-ray with atelectasis otherwise clear.  No focal infiltrate to indicate bacterial pneumonia.  No signs of fluid overload.  PERC negative doubt PE.  No conjunctival pallor, no bleeding reported, do not suspect symptomatic anemia.  Abdomen without peritoneal signs.  Suspect symptoms are related to COVID-19.  Ambulatory SPO2 98 to 100% on room air.  Appears appropriate for discharge home with supportive care. I discussed results, treatment plan, need for follow-up, and return precautions with the patient. Provided opportunity for questions, patient confirmed understanding and is in agreement with plan.   Final Clinical Impression(s) / ED Diagnoses Final diagnoses:  COVID-19    Rx / DC Orders ED Discharge Orders         Ordered    ondansetron (ZOFRAN ODT) 4 MG disintegrating tablet  Every 8 hours PRN     12/09/19 1530           Arieona Swaggerty, [place] R, PA-C 12/09/19 1545    Raeford Razor, MD 12/14/19 [id number]

## 2019-12-09 NOTE — Discharge Instructions (Addendum)
You were seen in the emergency department today for shortness of breath.  Your chest x-ray did not show any substantial abnormalities.  We are sending you home with an albuterol inhaler to use 1 to 2 puffs as needed every 4-6 hours for trouble breathing as well as zofran to use every 8 hours as needed for nausea.   We have prescribed you new medication(s) today. Discuss the medications prescribed today with your pharmacist as they can have adverse effects and interactions with your other medicines including over the counter and prescribed medications. Seek medical evaluation if you start to experience new or abnormal symptoms after taking one of these medicines, seek care immediately if you start to experience difficulty breathing, feeling of your throat closing, facial swelling, or rash as these could be indications of a more serious allergic reaction  We are instructing patient's with COVID 19 or symptoms of COVID 19 to quarantine themselves for 14 days. You may be able to discontinue self quarantine if the following conditions are met:   Persons with COVID-19 who have symptoms and were directed to care for themselves at home may discontinue home isolation under the  following conditions: - It has been at least 7 days have passed since symptoms first appeared. - AND at least 3 days (72 hours) have passed since recovery defined as resolution of fever without the use of fever-reducing medications and improvement in respiratory symptoms (e.g., cough, shortness of breath)  Please follow the below quarantine instructions.   Please follow up with primary care within 3-5 days for re-evaluation- call prior to going to the office to make them aware of your symptoms as some offices are altering their method of seeing patients with COVID 19 symptoms. Return to the ER for new or worsening symptoms including but not limited to increased work of breathing, chest pain, passing out, or any other concerns.        Person Under Monitoring Name: [facility]  Location: [address] [place] [address]   Infection Prevention Recommendations for Individuals Confirmed to have, or Being Evaluated for, 2019 Novel Coronavirus (COVID-19) Infection Who Receive Care at Home  Individuals who are confirmed to have, or are being evaluated for, COVID-19 should follow the prevention steps below until a healthcare provider or local or state health department says they can return to normal activities.  Stay home except to get medical care You should restrict activities outside your home, except for getting medical care. Do not go to work, school, or public areas, and do not use public transportation or taxis.  Call ahead before visiting your doctor Before your medical appointment, call the healthcare provider and tell them that you have, or are being evaluated for, COVID-19 infection. This will help the healthcare provider's office take steps to keep other people from getting infected. Ask your healthcare provider to call the local or state health department.  Monitor your symptoms Seek prompt medical attention if your illness is worsening (e.g., difficulty breathing). Before going to your medical appointment, call the healthcare provider and tell them that you have, or are being evaluated for, COVID-19 infection. Ask your healthcare provider to call the local or state health department.  Wear a facemask You should wear a facemask that covers your nose and mouth when you are in the same room with other people and when you visit a healthcare provider. People who live with or visit you should also wear a facemask while they are in the  same room with you.  Separate yourself from other people in your home As much as possible, you should stay in a different room from other people in your home. Also, you should use a separate bathroom, if available.  Avoid sharing household  items You should not share dishes, drinking glasses, cups, eating utensils, towels, bedding, or other items with other people in your home. After using these items, you should wash them thoroughly with soap and water.  Cover your coughs and sneezes Cover your mouth and nose with a tissue when you cough or sneeze, or you can cough or sneeze into your sleeve. Throw used tissues in a lined trash can, and immediately wash your hands with soap and water for at least 20 seconds or use an alcohol-based hand rub.  Wash your [facility] your hands often and thoroughly with soap and water for at least 20 seconds. You can use an alcohol-based hand sanitizer if soap and water are not available and if your hands are not visibly dirty. Avoid touching your eyes, nose, and mouth with unwashed hands.   Prevention Steps for Caregivers and Household Members of Individuals Confirmed to have, or Being Evaluated for, COVID-19 Infection Being Cared for in the Home  If you live with, or provide care at home for, a person confirmed to have, or being evaluated for, COVID-19 infection please follow these guidelines to prevent infection:  Follow healthcare provider's instructions Make sure that you understand and can help the patient follow any healthcare provider instructions for all care.  Provide for the patient's basic needs You should help the patient with basic needs in the home and provide support for getting groceries, prescriptions, and other personal needs.  Monitor the patient's symptoms If they are getting sicker, call his or her medical provider and tell them that the patient has, or is being evaluated for, COVID-19 infection. This will help the healthcare provider's office take steps to keep other people from getting infected. Ask the healthcare provider to call the local or state health department.  Limit the number of people who have contact with the patient If possible, have only one caregiver  for the patient. Other household members should stay in another home or place of residence. If this is not possible, they should stay in another room, or be separated from the patient as much as possible. Use a separate bathroom, if available. Restrict visitors who do not have an essential need to be in the home.  Keep older adults, very young children, and other sick people away from the patient Keep older adults, very young children, and those who have compromised immune systems or chronic health conditions away from the patient. This includes people with chronic heart, lung, or kidney conditions, diabetes, and cancer.  Ensure good ventilation Make sure that shared spaces in the home have good air flow, such as from an air conditioner or an opened window, weather permitting.  Wash your hands often Wash your hands often and thoroughly with soap and water for at least 20 seconds. You can use an alcohol based hand sanitizer if soap and water are not available and if your hands are not visibly dirty. Avoid touching your eyes, nose, and mouth with unwashed hands. Use disposable paper towels to dry your hands. If not available, use dedicated cloth towels and replace them when they become wet.  Wear a facemask and gloves Wear a disposable facemask at all times in the room and gloves when you touch or  have contact with the patient's blood, body fluids, and/or secretions or excretions, such as sweat, saliva, sputum, nasal mucus, vomit, urine, or feces.  Ensure the mask fits over your nose and mouth tightly, and do not touch it during use. Throw out disposable facemasks and gloves after using them. Do not reuse. Wash your hands immediately after removing your facemask and gloves. If your personal clothing becomes contaminated, carefully remove clothing and launder. Wash your hands after handling contaminated clothing. Place all used disposable facemasks, gloves, and other waste in a lined container  before disposing them with other household waste. Remove gloves and wash your hands immediately after handling these items.  Do not share dishes, glasses, or other household items with the patient Avoid sharing household items. You should not share dishes, drinking glasses, cups, eating utensils, towels, bedding, or other items with a patient who is confirmed to have, or being evaluated for, COVID-19 infection. After the person uses these items, you should wash them thoroughly with soap and water.  Wash laundry thoroughly Immediately remove and wash clothes or bedding that have blood, body fluids, and/or secretions or excretions, such as sweat, saliva, sputum, nasal mucus, vomit, urine, or feces, on them. Wear gloves when handling laundry from the patient. Read and follow directions on labels of laundry or clothing items and detergent. In general, wash and dry with the warmest temperatures recommended on the label.  Clean all areas the individual has used often Clean all touchable surfaces, such as counters, tabletops, doorknobs, bathroom fixtures, toilets, phones, keyboards, tablets, and bedside tables, every day. Also, clean any surfaces that may have blood, body fluids, and/or secretions or excretions on them. Wear gloves when cleaning surfaces the patient has come in contact with. Use a diluted bleach solution (e.g., dilute bleach with 1 part bleach and 10 parts water) or a household disinfectant with a label that says EPA-registered for coronaviruses. To make a bleach solution at home, add 1 tablespoon of bleach to 1 quart (4 cups) of water. For a larger supply, add  cup of bleach to 1 gallon (16 cups) of water. Read labels of cleaning products and follow recommendations provided on product labels. Labels contain instructions for safe and effective use of the cleaning product including precautions you should take when applying the product, such as wearing gloves or eye protection and making  sure you have good ventilation during use of the product. Remove gloves and wash hands immediately after cleaning.  Monitor yourself for signs and symptoms of illness Caregivers and household members are considered close contacts, should monitor their health, and will be asked to limit movement outside of the home to the extent possible. Follow the monitoring steps for close contacts listed on the symptom monitoring form.   ? If you have additional questions, contact your local health department or call the epidemiologist on call at [phone] (available 24/7). ? This guidance is subject to change. For the most up-to-date guidance from [facility], please refer to their website: YouBlogs.pl

## 2019-12-09 NOTE — ED Triage Notes (Signed)
Pt reports is Covid+ on Jan 2. Reports exertional SOB for couple days.

## 2019-12-13 ENCOUNTER — Ambulatory Visit: Payer: 59 | Attending: Internal Medicine

## 2019-12-13 DIAGNOSIS — Z20822 Contact with and (suspected) exposure to covid-19: Secondary | ICD-10-CM

## 2019-12-14 LAB — NOVEL CORONAVIRUS, NAA: SARS-CoV-2, NAA: NOT DETECTED

## 2021-09-27 ENCOUNTER — Telehealth: Payer: 59 | Admitting: Emergency Medicine

## 2021-09-27 DIAGNOSIS — K047 Periapical abscess without sinus: Secondary | ICD-10-CM | POA: Diagnosis not present

## 2021-09-27 MED ORDER — AMOXICILLIN-POT CLAVULANATE 875-125 MG PO TABS: 1.0000 | ORAL_TABLET | Freq: Two times a day (BID) | ORAL | 0 refills | Status: AC

## 2021-09-27 NOTE — Patient Instructions (Signed)
  Hannah Sanders, thank you for joining Cathlyn Parsons, NP for today's virtual visit.  While this provider is not your primary care provider (PCP), if your PCP is located in our provider database this encounter information will be shared with them immediately following your visit.  Consent: (Patient) Hannah Sanders provided verbal consent for this virtual visit at the beginning of the encounter.  Current Medications:  Current Outpatient Medications:    amoxicillin-clavulanate (AUGMENTIN) 875-125 MG tablet, Take 1 tablet by mouth 2 (two) times daily., Disp: 14 tablet, Rfl: 0   ferrous sulfate 325 (65 FE) MG tablet, Take 1 tablet (325 mg total) by mouth 2 (two) times daily with a meal., Disp: 60 tablet, Rfl: 1   fluconazole (DIFLUCAN) 150 MG tablet, TAKE 1 TABLET(150 MG) BY MOUTH 1 TIME FOR 1 DOSE, Disp: 1 tablet, Rfl: 2   hydrochlorothiazide (HYDRODIURIL) 25 MG tablet, Take 25 mg by mouth daily., Disp: , Rfl:    metroNIDAZOLE (METROGEL) 0.75 % vaginal gel, Insert appful vaginally hs x 5 nights., Disp: 70 g, Rfl: 0   ondansetron (ZOFRAN ODT) 4 MG disintegrating tablet, Take 1 tablet (4 mg total) by mouth every 8 (eight) hours as needed for nausea or vomiting., Disp: 5 tablet, Rfl: 0   tinidazole (TINDAMAX) 500 MG tablet, Take 4 tablet by mouth daily for 2 days., Disp: 8 tablet, Rfl: 2   Medications ordered in this encounter:  Meds ordered this encounter  Medications   amoxicillin-clavulanate (AUGMENTIN) 875-125 MG tablet    Sig: Take 1 tablet by mouth 2 (two) times daily.    Dispense:  14 tablet    Refill:  0     *If you need refills on other medications prior to your next appointment, please contact your pharmacy*  Follow-Up: Call back or seek an in-person evaluation if the symptoms worsen or if the condition fails to improve as anticipated.  Other Instructions Please contact your dentist today to be seen.   Stop taking Amoxicillin and start taking  amoxicillin-clavulanate   Continue using warm compresses. I hope you feel better soon!   If you have been instructed to have an in-person evaluation today at a local Urgent Care facility, please use the link below. It will take you to a list of all of our available [facility], including address, phone number and hours of operation. Please do not delay care.  [facility]  If you or a family member do not have a primary care provider, use the link below to schedule a visit and establish care. When you choose a [facility] primary care physician or advanced practice provider, you gain a long-term partner in health. Find a Primary Care Provider  Learn more about [facility]'s in-office and virtual care options: [facility] - Get Care Now

## 2021-09-27 NOTE — Progress Notes (Signed)
Virtual Visit Consent   Hannah Sanders, you are scheduled for a virtual visit with a Hannah Sanders today.     Just as with appointments in the office, your consent must be obtained to participate.  Your consent will be active for this visit and any virtual visit you may have with one of our providers in the next 365 days.     If you have a MyChart account, a copy of this consent can be sent to you electronically.  All virtual visits are billed to your insurance company just like a traditional visit in the office.    As this is a virtual visit, video technology does not allow for your Sanders to perform a traditional examination.  This may limit your Sanders's ability to fully assess your condition.  If your Sanders identifies any concerns that need to be evaluated in person or the need to arrange testing (such as labs, EKG, etc.), we will make arrangements to do so.     Although advances in technology are sophisticated, we cannot ensure that it will always work on either your end or our end.  If the connection with a video visit is poor, the visit may have to be switched to a telephone visit.  With either a video or telephone visit, we are not always able to ensure that we have a secure connection.     I need to obtain your verbal consent now.   Are you willing to proceed with your visit today?    Hannah Sanders has provided verbal consent on 09/27/2021 for a virtual visit (video or telephone).   Cathlyn Parsons, NP   Date: 09/27/2021 10:23 AM   Virtual Visit via Video Note   I, Cathlyn Parsons, connected with  Hannah Sanders  ([id number], 09-22-1976) on 09/27/21 at 10:15 AM EDT by a video-enabled telemedicine application and verified that I am speaking with the correct person using two identifiers.  Location: Patient: Virtual Visit Location Patient: Home Sanders: Virtual Visit Location Sanders: Home Office   I discussed the limitations of evaluation and  management by telemedicine and the availability of in person appointments. The patient expressed understanding and agreed to proceed.    History of Present Illness: Hannah Sanders is a 45 y.o. who identifies as a female who was assigned female at birth, and is being seen today for dental infection.  Patient had a tooth extracted on 09/17/2021.  Developed pain and a knot in her jaw 2 days later saw her dentist on 10/22 and was told there was no dry socket recommended she continue ibuprofen on 10/24 she saw her dentist again and was told she has a dental infection was given amoxicillin.  She feels like this is not relieving the infection and that her face is starting to swell and she feels congestion in her ears.  She has been using warm compresses.  The knot in her jaw is still there and does not feel fluctuant but instead feels hard.  HPI: HPI  Problems:  Patient Active Problem List   Diagnosis Date Noted   Anemia 03/03/2018   Cellulitis and abscess of buttock 03/03/2018   ARF (acute renal failure) (HCC) 03/03/2018   Postoperative fever    Sepsis (HCC) 02/25/2018   Breast enlargement 03/11/2016   OSA (obstructive sleep apnea) 02/02/2015   Severe obesity (BMI >= 40) (HCC) 02/02/2015    Allergies:  Allergies  Allergen Reactions   Shellfish-Derived Products Swelling  Medications:  Current Outpatient Medications:    amoxicillin-clavulanate (AUGMENTIN) 875-125 MG tablet, Take 1 tablet by mouth 2 (two) times daily., Disp: 14 tablet, Rfl: 0   ferrous sulfate 325 (65 FE) MG tablet, Take 1 tablet (325 mg total) by mouth 2 (two) times daily with a meal., Disp: 60 tablet, Rfl: 1   fluconazole (DIFLUCAN) 150 MG tablet, TAKE 1 TABLET(150 MG) BY MOUTH 1 TIME FOR 1 DOSE, Disp: 1 tablet, Rfl: 2   hydrochlorothiazide (HYDRODIURIL) 25 MG tablet, Take 25 mg by mouth daily., Disp: , Rfl:    metroNIDAZOLE (METROGEL) 0.75 % vaginal gel, Insert appful vaginally hs x 5 nights., Disp: 70 g, Rfl: 0    ondansetron (ZOFRAN ODT) 4 MG disintegrating tablet, Take 1 tablet (4 mg total) by mouth every 8 (eight) hours as needed for nausea or vomiting., Disp: 5 tablet, Rfl: 0   tinidazole (TINDAMAX) 500 MG tablet, Take 4 tablet by mouth daily for 2 days., Disp: 8 tablet, Rfl: 2  Observations/Objective: Patient is well-developed, well-nourished in no acute distress.  Resting comfortably  at home.  Head is normocephalic, atraumatic.  No labored breathing.  Speech is clear and coherent with logical content.  Patient is alert and oriented at baseline.    Assessment and Plan: 1. Dental infection  I prescribed Augmentin to take instead of amoxicillin.  Patient to continue using warm compresses.  Patient to call her dentist today and get follow-up evaluation of infection today.  Follow Up Instructions: I discussed the assessment and treatment plan with the patient. The patient was provided an opportunity to ask questions and all were answered. The patient agreed with the plan and demonstrated an understanding of the instructions.  A copy of instructions were sent to the patient via MyChart unless otherwise noted below.   The patient was advised to call back or seek an in-person evaluation if the symptoms worsen or if the condition fails to improve as anticipated.  Time:  I spent 12 minutes with the patient via telehealth technology discussing the above problems/concerns.    Cathlyn Parsons, NP

## 2021-11-12 ENCOUNTER — Other Ambulatory Visit: Payer: Self-pay | Admitting: Obstetrics and Gynecology

## 2021-11-12 DIAGNOSIS — Z1231 Encounter for screening mammogram for malignant neoplasm of breast: Secondary | ICD-10-CM

## 2021-11-15 ENCOUNTER — Other Ambulatory Visit: Payer: Self-pay | Admitting: Obstetrics and Gynecology

## 2021-11-15 ENCOUNTER — Ambulatory Visit
Admission: RE | Admit: 2021-11-15 | Discharge: 2021-11-15 | Disposition: A | Discharge status: Home / Self Care | Payer: 59 | Source: Ambulatory Visit

## 2021-11-15 ENCOUNTER — Other Ambulatory Visit: Payer: Self-pay

## 2021-11-15 DIAGNOSIS — Z1231 Encounter for screening mammogram for malignant neoplasm of breast: Secondary | ICD-10-CM

## 2022-03-15 IMAGING — MG MM DIGITAL SCREENING BILAT W/ TOMO AND CAD
8 series · 8 of 24 positions shown · non-contrast
Comparison: Previous exam(s).

CLINICAL DATA: Screening.

EXAM:
DIGITAL SCREENING BILATERAL MAMMOGRAM WITH TOMOSYNTHESIS AND CAD
TECHNIQUE: Bilateral screening digital craniocaudal and mediolateral oblique
mammograms were obtained. Bilateral screening digital breast
tomosynthesis was performed. The images were evaluated with
computer-aided detection.

[R CC synth-2D]
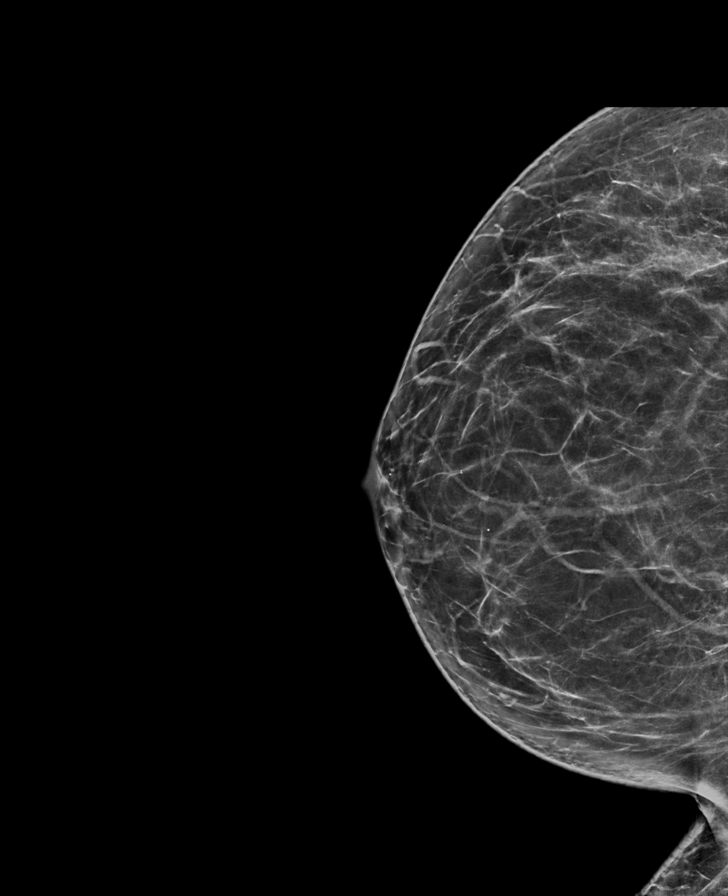

[L MLO synth-2D]
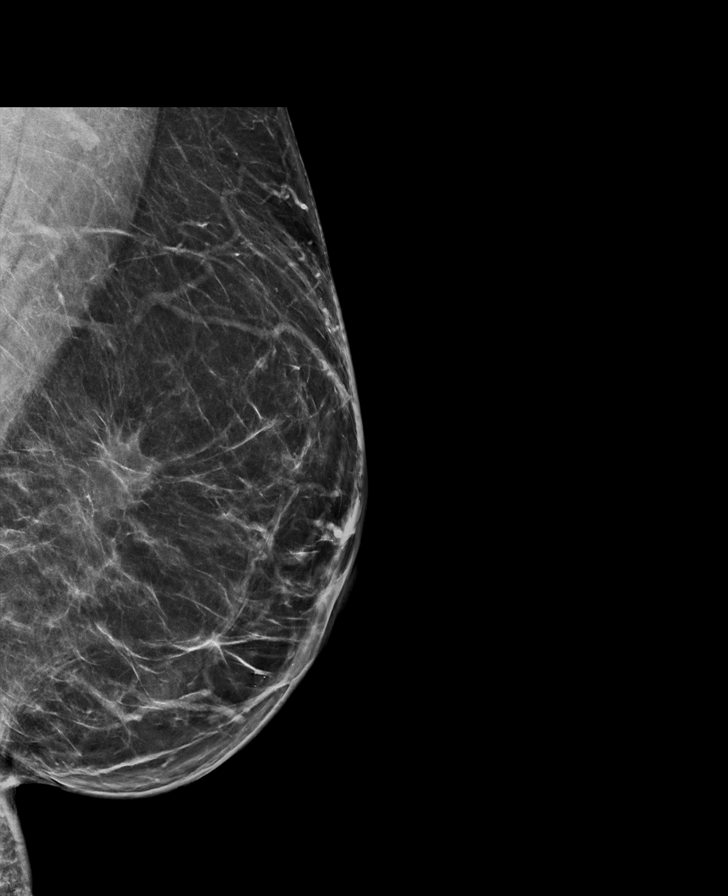

[L CC synth-2D]
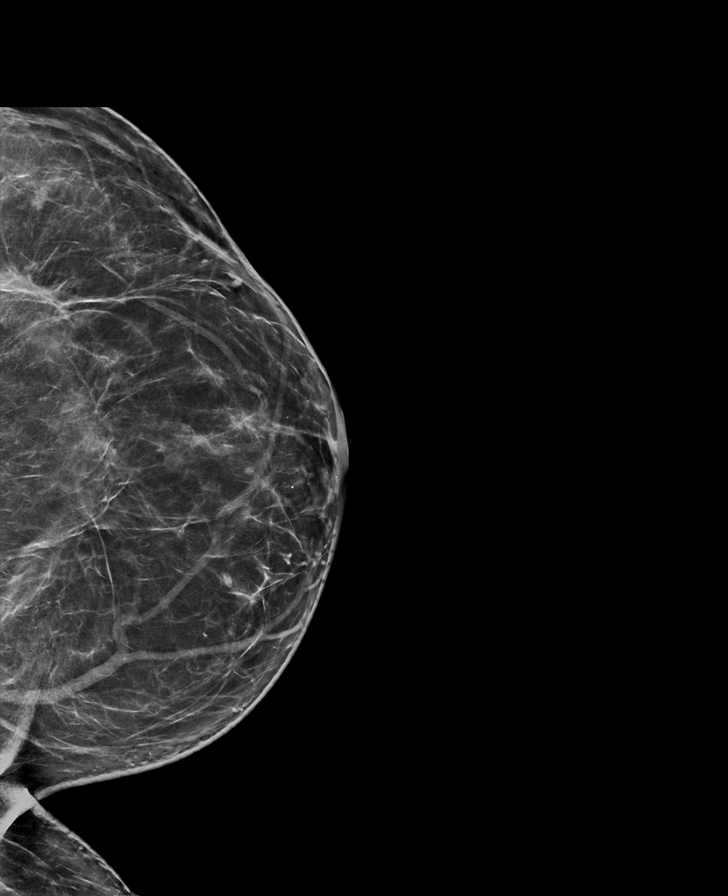

[R MLO synth-2D]
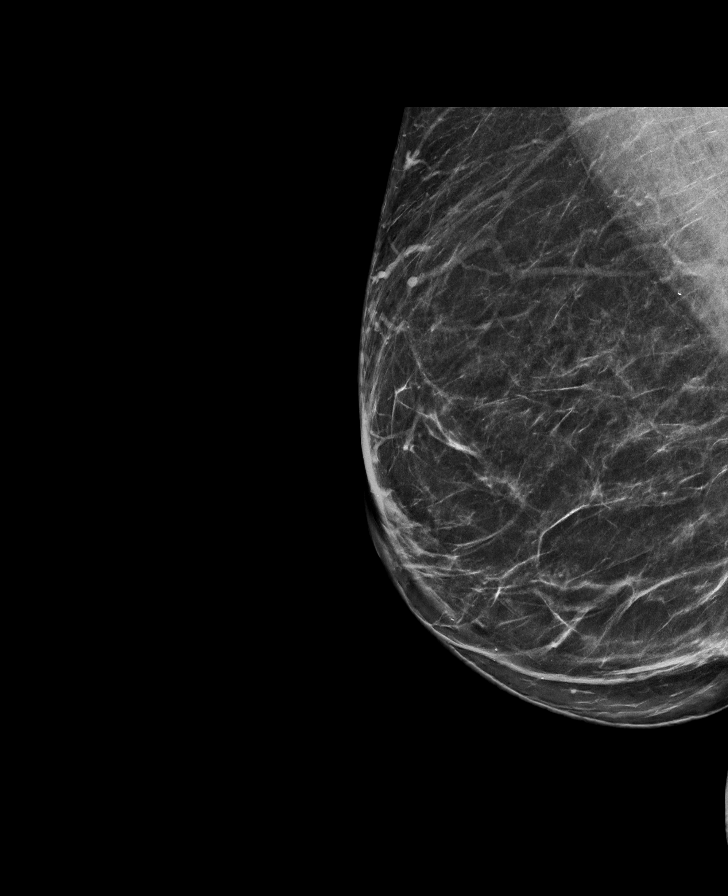

[L MLO tomo · tomo slice 41/81.0]
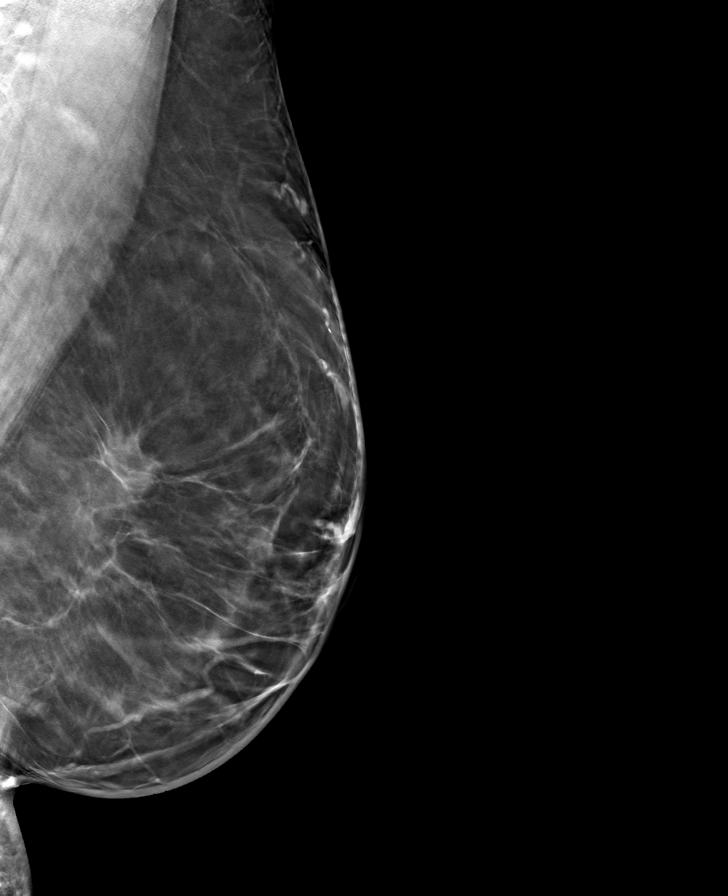

[R CC tomo · tomo slice 40/79.0]
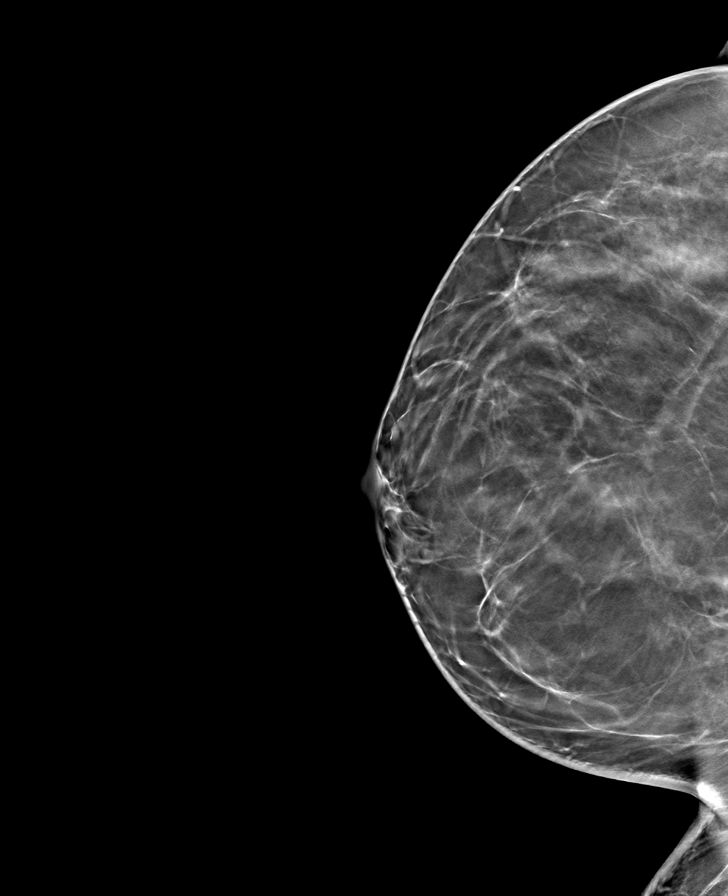

[R MLO tomo · tomo slice 41/80.0]
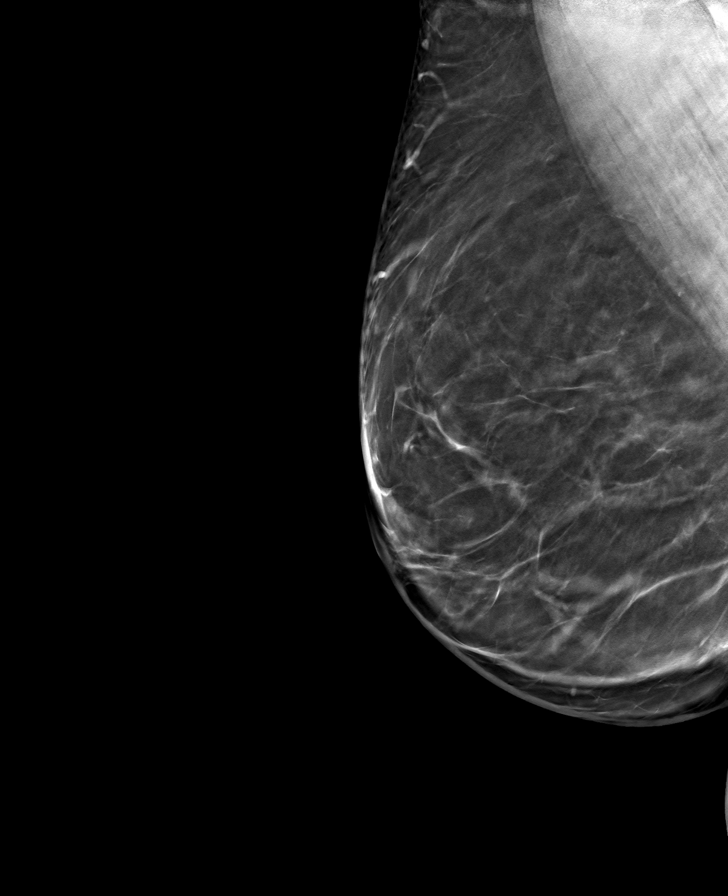

[L CC tomo · tomo slice 40/79.0]
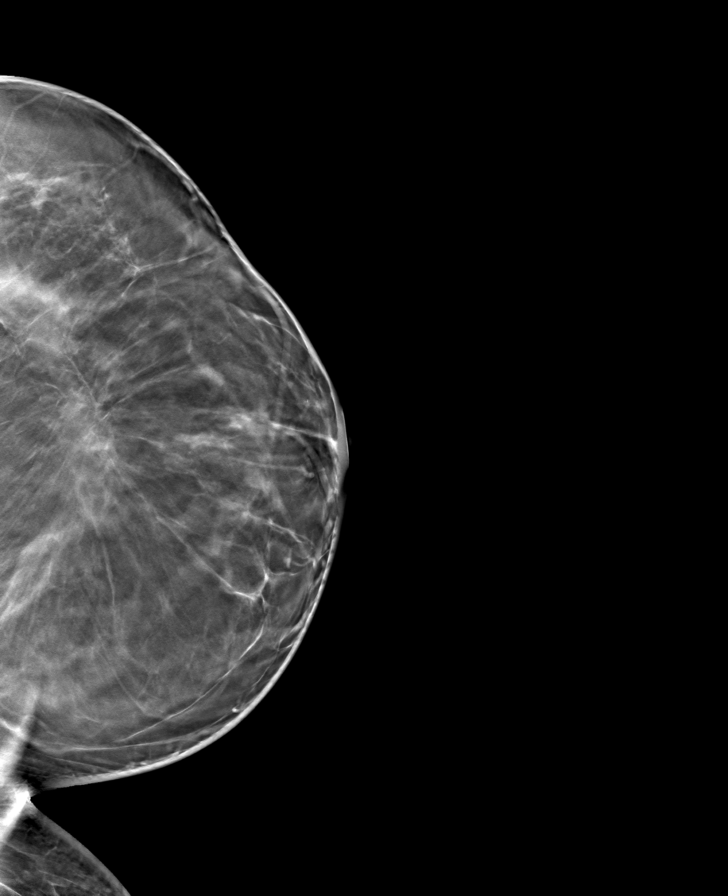

[8 of 24 positions shown; findings below may reference images not displayed]

ACR Breast Density Category b: There are scattered areas of
fibroglandular density.
FINDINGS: There are no findings suspicious for malignancy. Stable post
reduction changes.
IMPRESSION: No mammographic evidence of malignancy. A result letter of this
screening mammogram will be mailed directly to the patient.

RECOMMENDATION:
Screening mammogram in one year. (Code:CG-K-XKU)

BI-RADS CATEGORY  2: Benign.

## 2022-10-01 ENCOUNTER — Other Ambulatory Visit (HOSPITAL_COMMUNITY): Payer: Self-pay

## 2022-10-01 MED ORDER — WEGOVY 1 MG/0.5ML ~~LOC~~ SOAJ
1.0000 mg | SUBCUTANEOUS | 0 refills | Status: DC
  Filled 2022-10-01 – 2022-10-02 (×3): qty 2, 28d supply, fill #0

## 2022-10-01 MED ORDER — SAXENDA 18 MG/3ML ~~LOC~~ SOPN
PEN_INJECTOR | SUBCUTANEOUS | 0 refills | Status: AC
  Filled 2022-10-01: qty 6, 30d supply, fill #0
  Filled 2022-10-01: qty 12, 38d supply, fill #0

## 2022-10-02 ENCOUNTER — Other Ambulatory Visit (HOSPITAL_COMMUNITY): Payer: Self-pay

## 2022-10-02 MED ORDER — NOVOFINE PLUS PEN NEEDLE 32G X 4 MM MISC
Freq: Every day | 0 refills | Status: AC
  Filled 2022-10-02: qty 100, 100d supply, fill #0

## 2022-10-03 ENCOUNTER — Other Ambulatory Visit (HOSPITAL_COMMUNITY): Payer: Self-pay

## 2022-10-15 ENCOUNTER — Other Ambulatory Visit: Payer: Self-pay

## 2022-10-15 MED ORDER — MELOXICAM 15 MG PO TABS
15.0000 mg | ORAL_TABLET | Freq: Every day | ORAL | 0 refills | Status: AC
  Filled 2022-10-15: qty 30, 30d supply, fill #0

## 2022-10-21 ENCOUNTER — Other Ambulatory Visit: Payer: Self-pay

## 2022-11-03 ENCOUNTER — Other Ambulatory Visit: Payer: Self-pay

## 2022-11-03 ENCOUNTER — Other Ambulatory Visit (HOSPITAL_COMMUNITY): Payer: Self-pay

## 2022-11-03 MED ORDER — PHENTERMINE HCL 37.5 MG PO TABS
37.5000 mg | ORAL_TABLET | Freq: Every day | ORAL | 0 refills | Status: DC
Start: 2022-11-03 — End: 2022-12-05
  Filled 2022-11-03: qty 30, 30d supply, fill #0

## 2022-11-03 MED ORDER — WEGOVY 1 MG/0.5ML ~~LOC~~ SOAJ
1.0000 mg | SUBCUTANEOUS | 0 refills | Status: DC
  Filled 2022-11-03 (×2): qty 2, 28d supply, fill #0

## 2022-12-05 ENCOUNTER — Other Ambulatory Visit (HOSPITAL_COMMUNITY): Payer: Self-pay

## 2022-12-05 MED ORDER — PHENTERMINE HCL 37.5 MG PO TABS
37.5000 mg | ORAL_TABLET | Freq: Every day | ORAL | 0 refills | Status: DC
Start: 2022-12-05 — End: 2023-07-17
  Filled 2022-12-05 – 2023-05-05 (×4): qty 30, 30d supply, fill #0

## 2022-12-05 MED ORDER — WEGOVY 1 MG/0.5ML ~~LOC~~ SOAJ
1.0000 mg | SUBCUTANEOUS | 0 refills | Status: AC
  Filled 2022-12-05: qty 2, 28d supply, fill #0

## 2022-12-08 ENCOUNTER — Other Ambulatory Visit (HOSPITAL_COMMUNITY): Payer: Self-pay

## 2022-12-09 ENCOUNTER — Other Ambulatory Visit: Payer: Self-pay

## 2022-12-19 ENCOUNTER — Other Ambulatory Visit (HOSPITAL_COMMUNITY): Payer: Self-pay

## 2022-12-22 ENCOUNTER — Other Ambulatory Visit (HOSPITAL_COMMUNITY): Payer: Self-pay

## 2022-12-25 ENCOUNTER — Other Ambulatory Visit (HOSPITAL_COMMUNITY): Payer: Self-pay

## 2022-12-26 ENCOUNTER — Other Ambulatory Visit (HOSPITAL_COMMUNITY): Payer: Self-pay

## 2022-12-26 ENCOUNTER — Other Ambulatory Visit: Payer: Self-pay

## 2023-03-16 ENCOUNTER — Other Ambulatory Visit (HOSPITAL_COMMUNITY): Payer: Self-pay

## 2023-03-26 ENCOUNTER — Other Ambulatory Visit (HOSPITAL_COMMUNITY): Payer: Self-pay

## 2023-05-05 ENCOUNTER — Other Ambulatory Visit (HOSPITAL_COMMUNITY): Payer: Self-pay

## 2023-06-23 ENCOUNTER — Other Ambulatory Visit (HOSPITAL_COMMUNITY): Payer: Self-pay

## 2023-06-23 MED ORDER — WEGOVY 0.5 MG/0.5ML ~~LOC~~ SOAJ
0.5000 mg | SUBCUTANEOUS | 0 refills | Status: DC
  Filled 2023-06-23: qty 2, 28d supply, fill #0

## 2023-06-24 ENCOUNTER — Other Ambulatory Visit (HOSPITAL_COMMUNITY): Payer: Self-pay

## 2023-07-01 ENCOUNTER — Other Ambulatory Visit (HOSPITAL_COMMUNITY): Payer: Self-pay

## 2023-07-17 ENCOUNTER — Other Ambulatory Visit: Payer: Self-pay

## 2023-07-17 ENCOUNTER — Other Ambulatory Visit (HOSPITAL_COMMUNITY): Payer: Self-pay

## 2023-07-17 MED ORDER — SULFAMETHOXAZOLE-TRIMETHOPRIM 800-160 MG PO TABS
1.0000 | ORAL_TABLET | Freq: Two times a day (BID) | ORAL | 0 refills | Status: DC
  Filled 2023-07-17: qty 12, 6d supply, fill #0

## 2023-07-17 MED ORDER — WEGOVY 1 MG/0.5ML ~~LOC~~ SOAJ
1.0000 mg | SUBCUTANEOUS | 0 refills | Status: AC
  Filled 2023-07-17: qty 2, 28d supply, fill #0

## 2023-07-17 MED ORDER — PHENTERMINE HCL 37.5 MG PO TABS
37.5000 mg | ORAL_TABLET | Freq: Every day | ORAL | 0 refills | Status: DC
  Filled 2023-07-17: qty 30, 30d supply, fill #0

## 2023-07-23 ENCOUNTER — Other Ambulatory Visit: Payer: Self-pay

## 2023-07-23 ENCOUNTER — Other Ambulatory Visit (HOSPITAL_BASED_OUTPATIENT_CLINIC_OR_DEPARTMENT_OTHER): Payer: Self-pay

## 2023-07-23 MED ORDER — WEGOVY 0.5 MG/0.5ML ~~LOC~~ SOAJ
0.5000 mg | SUBCUTANEOUS | 0 refills | Status: AC
  Filled 2023-08-17: qty 2, 28d supply, fill #0

## 2023-07-24 ENCOUNTER — Other Ambulatory Visit (HOSPITAL_BASED_OUTPATIENT_CLINIC_OR_DEPARTMENT_OTHER): Payer: Self-pay

## 2023-08-16 ENCOUNTER — Other Ambulatory Visit (HOSPITAL_COMMUNITY): Payer: Self-pay

## 2023-08-16 MED ORDER — VITAMIN D (ERGOCALCIFEROL) 1.25 MG (50000 UNIT) PO CAPS
50000.0000 [IU] | ORAL_CAPSULE | ORAL | 5 refills | Status: AC
  Filled 2023-08-16: qty 4, 28d supply, fill #0
  Filled 2024-02-10: qty 4, 28d supply, fill #1
  Filled 2024-04-06: qty 4, 28d supply, fill #2

## 2023-08-16 MED ORDER — WEGOVY 1.7 MG/0.75ML ~~LOC~~ SOAJ
1.7000 mg | SUBCUTANEOUS | 0 refills | Status: AC
  Filled 2023-08-16: qty 3, 28d supply, fill #0

## 2023-08-16 MED ORDER — PHENTERMINE HCL 37.5 MG PO TABS
37.5000 mg | ORAL_TABLET | Freq: Every day | ORAL | 0 refills | Status: AC
  Filled 2023-08-16: qty 30, 30d supply, fill #0

## 2023-08-16 MED ORDER — SULFAMETHOXAZOLE-TRIMETHOPRIM 800-160 MG PO TABS
1.0000 | ORAL_TABLET | Freq: Two times a day (BID) | ORAL | 0 refills | Status: AC
  Filled 2023-08-16: qty 12, 6d supply, fill #0

## 2023-08-17 ENCOUNTER — Other Ambulatory Visit (HOSPITAL_COMMUNITY): Payer: Self-pay

## 2023-08-18 ENCOUNTER — Other Ambulatory Visit (HOSPITAL_BASED_OUTPATIENT_CLINIC_OR_DEPARTMENT_OTHER): Payer: Self-pay

## 2023-11-30 ENCOUNTER — Other Ambulatory Visit (HOSPITAL_COMMUNITY): Payer: Self-pay

## 2023-11-30 MED ORDER — BENZONATATE 200 MG PO CAPS
200.0000 mg | ORAL_CAPSULE | Freq: Three times a day (TID) | ORAL | 0 refills | Status: AC | PRN
  Filled 2023-11-30: qty 30, 10d supply, fill #0

## 2023-11-30 MED ORDER — OSELTAMIVIR PHOSPHATE 75 MG PO CAPS
75.0000 mg | ORAL_CAPSULE | Freq: Two times a day (BID) | ORAL | 0 refills | Status: AC
  Filled 2023-11-30: qty 10, 5d supply, fill #0

## 2024-02-08 ENCOUNTER — Other Ambulatory Visit (HOSPITAL_COMMUNITY): Payer: Self-pay

## 2024-02-08 MED ORDER — HYDROCHLOROTHIAZIDE 25 MG PO TABS
25.0000 mg | ORAL_TABLET | Freq: Every day | ORAL | 3 refills | Status: AC
  Filled 2024-02-08: qty 30, 30d supply, fill #0
  Filled 2024-02-10: qty 90, 90d supply, fill #0

## 2024-02-08 MED ORDER — WEGOVY 0.25 MG/0.5ML ~~LOC~~ SOAJ
0.2500 mg | SUBCUTANEOUS | 0 refills | Status: DC
  Filled 2024-02-08: qty 2, 28d supply, fill #0

## 2024-02-11 ENCOUNTER — Other Ambulatory Visit (HOSPITAL_COMMUNITY): Payer: Self-pay

## 2024-02-12 ENCOUNTER — Other Ambulatory Visit (HOSPITAL_COMMUNITY): Payer: Self-pay

## 2024-02-23 ENCOUNTER — Other Ambulatory Visit (HOSPITAL_COMMUNITY): Payer: Self-pay

## 2024-03-09 ENCOUNTER — Other Ambulatory Visit (HOSPITAL_COMMUNITY): Payer: Self-pay

## 2024-03-09 MED ORDER — WEGOVY 0.5 MG/0.5ML ~~LOC~~ SOAJ
0.5000 mg | SUBCUTANEOUS | 0 refills | Status: DC
  Filled 2024-03-09: qty 2, 28d supply, fill #0

## 2024-03-11 ENCOUNTER — Other Ambulatory Visit (HOSPITAL_COMMUNITY): Payer: Self-pay

## 2024-04-06 ENCOUNTER — Other Ambulatory Visit (HOSPITAL_COMMUNITY): Payer: Self-pay

## 2024-04-06 ENCOUNTER — Other Ambulatory Visit: Payer: Self-pay

## 2024-04-06 MED ORDER — WEGOVY 1 MG/0.5ML ~~LOC~~ SOAJ
1.0000 mg | SUBCUTANEOUS | 0 refills | Status: DC
  Filled 2024-04-06: qty 2, 28d supply, fill #0

## 2024-04-11 ENCOUNTER — Other Ambulatory Visit (HOSPITAL_COMMUNITY): Payer: Self-pay
